# Patient Record
Sex: Female | Born: 1965 | Race: Asian
Health system: Southern US, Academic
[De-identification: ages and names within clinical notes are randomized; demographics above are authoritative.]

## PROBLEM LIST (undated history)

## (undated) ENCOUNTER — Encounter

## (undated) ENCOUNTER — Telehealth

## (undated) ENCOUNTER — Other Ambulatory Visit

## (undated) ENCOUNTER — Inpatient Hospital Stay

## (undated) DIAGNOSIS — I10 Essential (primary) hypertension: Secondary | ICD-10-CM

## (undated) HISTORY — PX: ABDOMINAL HYSTERECTOMY: SHX81

---

## 2015-11-05 ENCOUNTER — Ambulatory Visit: Attending: Family Medicine | Primary: Family Medicine

## 2015-11-05 DIAGNOSIS — Z9114 Patient's other noncompliance with medication regimen: Secondary | ICD-10-CM

## 2015-11-05 DIAGNOSIS — K210 Gastro-esophageal reflux disease with esophagitis: Secondary | ICD-10-CM

## 2015-11-05 DIAGNOSIS — Z114 Encounter for screening for human immunodeficiency virus [HIV]: Secondary | ICD-10-CM

## 2015-11-05 DIAGNOSIS — D229 Melanocytic nevi, unspecified: Secondary | ICD-10-CM

## 2015-11-05 DIAGNOSIS — I10 Essential (primary) hypertension: Secondary | ICD-10-CM

## 2015-11-05 DIAGNOSIS — Z23 Encounter for immunization: Secondary | ICD-10-CM

## 2015-11-05 DIAGNOSIS — G4733 Obstructive sleep apnea (adult) (pediatric): Secondary | ICD-10-CM

## 2015-11-05 DIAGNOSIS — Z1211 Encounter for screening for malignant neoplasm of colon: Secondary | ICD-10-CM

## 2015-11-05 DIAGNOSIS — Z6841 Body Mass Index (BMI) 40.0 and over, adult: Principal | ICD-10-CM

## 2015-11-05 DIAGNOSIS — Z8679 Personal history of other diseases of the circulatory system: Secondary | ICD-10-CM

## 2015-11-05 MED ORDER — OMEPRAZOLE 40 MG PO CPDR
40 mg | Freq: Every day | ORAL | 3 refills | Status: CP
Start: 2015-11-05 — End: 2015-12-24

## 2015-11-05 MED ORDER — AMLODIPINE BESYLATE 10 MG PO TABS
10 mg | Freq: Every day | ORAL | 0 refills | Status: CP
Start: 2015-11-05 — End: 2015-12-24

## 2015-11-05 MED ORDER — HYDROCHLOROTHIAZIDE 25 MG PO TABS
25 mg | Freq: Every day | ORAL | 0 refills | Status: CP
Start: 2015-11-05 — End: 2015-12-24

## 2015-11-05 NOTE — Progress Notes
Mouth/Throat: Oropharynx is clear and moist. No oropharyngeal exudate.   Eyes: Conjunctivae and EOM are normal. Pupils are equal, round, and reactive to light. Right eye exhibits no discharge. Left eye exhibits no discharge.   Neck: Normal range of motion. Neck supple. No JVD present. No thyromegaly present.   Cardiovascular: Normal rate, regular rhythm and normal heart sounds.  Exam reveals no gallop and no friction rub.    No murmur heard.  Pulmonary/Chest: Effort normal and breath sounds normal. She has no wheezes. She has no rales. She exhibits no tenderness.   Abdominal: Soft. Bowel sounds are normal. She exhibits no mass. There is no tenderness. There is no rebound. No hernia.   Musculoskeletal: Normal range of motion. She exhibits no edema or deformity.   Lymphadenopathy:     She has no cervical adenopathy.   Neurological: She is oriented to person, place, and time. No cranial nerve deficit.   Skin: Skin is warm. She is not diaphoretic. No erythema. No pallor.   Mole on the face    Psychiatric: She has a normal mood and affect. Her behavior is normal. Judgment and thought content normal.       Assessment:       ICD-10-CM ICD-9-CM    1. BMI 40.0-44.9, adult Z68.41 V85.41 Lipid Panel      Lipid Panel   2. Morbid obesity due to excess calories E66.01 278.01 CBC and Differential      Comprehensive Metabolic Panel      Lipid Panel      CBC and Differential      Comprehensive Metabolic Panel      Lipid Panel   3. Essential hypertension I10 401.9 CBC and Differential      Comprehensive Metabolic Panel      Lipid Panel      Refer to Ophthalmology      CBC and Differential      Comprehensive Metabolic Panel      Lipid Panel   4. Obstructive sleep apnea G47.33 327.23 DIAGNOSTIC SLEEP STUDY   5. Screen for colon cancer Z12.11 V76.51 IMMUNOCHEMICAL FECAL OCCULT BLOOD-DIAGNO      IMMUNOCHEMICAL FECAL OCCULT BLOOD-DIAGNO   6. Atypical mole D22.9 216.9 Refer to Dermatology

## 2015-11-05 NOTE — Progress Notes
Respiratory: Negative.    Cardiovascular: Negative.    Gastrointestinal: Negative.    Endocrine: Negative.    Genitourinary: Negative.    Musculoskeletal: Negative.    Neurological:        Snoring stop breathing at night feel tired during day time         Objective:        VITAL SIGNS (all recorded)      Clinic Vitals       11/05/15 0958 11/05/15 1024          Amb Encounter Vitals    Weight 106.5 kg (234 lb 12.8 oz)    -IC at 11/05/15 0959       Height 1.6 m (5\' 3" )    -IC at 11/05/15 0959       BMI (Calculated) 41.68    -IC at 11/05/15 0959       BSA (Calculated - sq m) 2.18    -IC at 11/05/15 0959       BP 139/90    -IC at 11/05/15 0959       BP Location Right upper arm    -IC at 11/05/15 0959       Position Sitting    -IC at 11/05/15 0959       Pulse 70    -IC at 11/05/15 0959       Pulse Source Radial    -IC at 11/05/15 0959       Pulse Quality Normal    -IC at 11/05/15 0959       Resp 18    -IC at 11/05/15 0959       Respiration Quality Normal    -IC at 11/05/15 0959       Temp 36.7 ?C (98.1 ?F)    -IC at 11/05/15 0959       Temperature Source Oral    -IC at 11/05/15 0959       Neck Cir  15.5 in    -MH at 11/05/15 1024      Education/Communication Barriers?    Learning/Communication Barriers? No    -IC at 11/05/15 [id number]       Fall Risk Assessment    Had recent fall / Last 6 months? No recent fall    -IC at 11/05/15 [id number]       Does patient have a fear of falling? No    -IC at 11/05/15 [id number]         User Key  (r) = Recorded By, (t) = Taken By, (c) = Cosigned By    [facility] Name Effective Dates    IC Class-Mercado, [place], [place] 05/14/15 -     MH Shela Commons, MD 06/03/15 -         Physical Exam   Constitutional: She is oriented to person, place, and time. She appears well-developed and well-nourished. No distress.   obese   HENT:   Head: Normocephalic and atraumatic.   Right Ear: External ear normal.   Left Ear: External ear normal.   Nose: Nose normal.

## 2015-11-05 NOTE — Addendum Note
Addended by: Kandice Hams on: 11/05/2015 11:21 AM      Modules accepted: Orders

## 2015-11-05 NOTE — Progress Notes
7. H/O medication noncompliance Z91.14 V15.81 hydroCHLOROthiazide (HYDRODIURIL) 25 MG Tablet      amLODIPine (NORVASC) 10 MG Tablet   8. H/O: hypertension Z86.79 V12.59 hydroCHLOROthiazide (HYDRODIURIL) 25 MG Tablet      amLODIPine (NORVASC) 10 MG Tablet   9. GERD with esophagitis K21.0 530.11 omeprazole (PriLOSEC) 40 MG Capsule Delayed Release          Plan:     Orders Placed This Encounter   Medications   ? hydroCHLOROthiazide (HYDRODIURIL) 25 MG Tablet     Sig: Take 1 tablet by mouth daily.     Dispense:  30 tablet     Refill:  0   ? amLODIPine (NORVASC) 10 MG Tablet     Sig: Take 1 tablet by mouth daily.     Dispense:  30 tablet     Refill:  0   ? omeprazole (PriLOSEC) 40 MG Capsule Delayed Release     Sig: Take 1 capsule by mouth daily.     Dispense:  90 capsule     Refill:  3     Orders Placed This Encounter   Procedures   ? IMMUNOCHEMICAL FECAL OCCULT BLOOD-DIAGNO   ? Comprehensive Metabolic Panel   ? Lipid Panel   ? Refer to Ophthalmology   ? Refer to Dermatology   ? DIAGNOSTIC SLEEP STUDY   Marcea's Estimated body mass index is 41.59 kg/(m^2) as calculated from the following:    Height as of this encounter: 1.6 m (5\' 3" ).    Weight as of this encounter: 106.5 kg (234 lb 12.8 oz).      The health risks associated with an elevated BMI were discussed and education was provided in the AVS.  See orders for any further follow up plans.      Health Maintenance was reviewed. The patient's HM Topic list was:                                            Health Maintenance   Topic Date Due   ? Lipid Profile  18-Aug-1965   ? Basic Metabolic Panel  [id number]   ? USPSTF HIV Risk Assessment  04/10/1980   ? Preventive Wellness Visit  04/11/1983   ? DTaP,Tdap,and Td Vaccines (1 - Tdap) 04/10/1984   ? Lipid Profile  04/11/2010   ? Colon Cancer Screening  04/11/2015   ? Influenza Vaccine (1) 10/11/2015   ? Mammogram Discussion  04/10/2016

## 2015-11-05 NOTE — Progress Notes
Subjective:   Kylie Maldonado is a 50 y.o. female being seen today for Chronic Care (Pt. complains of possible UTI, ) and Medications Refill       HPI Comments: New pt to clinic recently moved from [place]. H/o htn welll controlled on medication no chest painno sob no palpitatio . C/o snoring at night stop breathing very tired in the day time feel sleepy all day .   trying to lose weight mallapeti score iv neck size 15/12epworth score 16. c/o mole on the face bleeding    Medications Refill         Past Medical History:   Diagnosis Date   ? Hypertension      Past Surgical History:   Procedure Laterality Date   ? HYSTERECTOMY     ? KNEE SURGERY Right    ? KNEE SURGERY Right 03/2006   ? TUBAL LIGATION     ? TUBAL LIGATION  06/19/1986     History reviewed. No pertinent family history.  Social History     Social History   ? Marital status: Divorced     Spouse name: N/A   ? Number of children: N/A   ? Years of education: N/A     Occupational History   ? Not on file.     Social History Main Topics   ? Smoking status: Former Smoker   ? Smokeless tobacco: Never Used   ? Alcohol use Yes      Comment: 2x/mo   ? Drug use: No   ? Sexual activity: Yes     Partners: Male     Other Topics Concern   ? Not on file     Social History Narrative     Current Outpatient Prescriptions on File Prior to Visit   Medication Sig   ? amLODIPine (NORVASC) 10 MG Tablet Take by mouth daily.   ? [DISCONTINUED] amLODIPine (NORVASC) 10 MG Tablet Take 1 Tablet by mouth daily.   ? Black Cohosh 40 MG Capsule Take 40 mg by mouth daily.   ? hydroCHLOROthiazide (HYDRODIURIL) 25 MG Tablet Take by mouth daily.   ? [DISCONTINUED] hydroCHLOROthiazide (HYDRODIURIL) 25 MG Tablet Take 1 Tablet by mouth daily.   ? multivitamin Tablet Take by mouth daily.     No current facility-administered medications on file prior to visit.      No Known Allergies      Review of Systems  Review of Systems   Constitutional: Negative.    HENT: Negative.    Eyes: Negative.

## 2015-11-27 ENCOUNTER — Encounter: Primary: Family Medicine

## 2015-12-02 DIAGNOSIS — G4733 Obstructive sleep apnea (adult) (pediatric): Principal | ICD-10-CM

## 2015-12-05 ENCOUNTER — Encounter: Attending: Family Medicine | Primary: Family Medicine

## 2015-12-23 ENCOUNTER — Ambulatory Visit: Attending: Family Medicine | Primary: Family Medicine

## 2015-12-23 ENCOUNTER — Ambulatory Visit: Attending: Optometrist | Primary: Family Medicine

## 2015-12-23 DIAGNOSIS — L0100 Impetigo, unspecified: Secondary | ICD-10-CM

## 2015-12-23 DIAGNOSIS — H524 Presbyopia: Secondary | ICD-10-CM

## 2015-12-23 DIAGNOSIS — K210 Gastro-esophageal reflux disease with esophagitis: Secondary | ICD-10-CM

## 2015-12-23 DIAGNOSIS — Z8679 Personal history of other diseases of the circulatory system: Secondary | ICD-10-CM

## 2015-12-23 DIAGNOSIS — K219 Gastro-esophageal reflux disease without esophagitis: Secondary | ICD-10-CM

## 2015-12-23 DIAGNOSIS — I10 Essential (primary) hypertension: Secondary | ICD-10-CM

## 2015-12-23 DIAGNOSIS — H35033 Hypertensive retinopathy, bilateral: Principal | ICD-10-CM

## 2015-12-23 DIAGNOSIS — Z6841 Body Mass Index (BMI) 40.0 and over, adult: Secondary | ICD-10-CM

## 2015-12-23 DIAGNOSIS — Z9114 Patient's other noncompliance with medication regimen: Secondary | ICD-10-CM

## 2015-12-23 DIAGNOSIS — G4733 Obstructive sleep apnea (adult) (pediatric): Principal | ICD-10-CM

## 2015-12-23 DIAGNOSIS — H02889 Meibomian gland dysfunction of unspecified eye, unspecified eyelid: Secondary | ICD-10-CM

## 2015-12-23 MED ORDER — HYDROCHLOROTHIAZIDE 25 MG PO TABS
25 mg | Freq: Every day | ORAL | 3 refills | Status: CP
Start: 2015-12-23 — End: 2016-05-20

## 2015-12-23 MED ORDER — CEPHALEXIN 500 MG PO CAPS
500 mg | Freq: Three times a day (TID) | ORAL | 0 refills | Status: CP
Start: 2015-12-23 — End: 2016-03-11

## 2015-12-23 MED ORDER — OMEPRAZOLE 40 MG PO CPDR
40 mg | Freq: Every day | ORAL | 3 refills | Status: CP
Start: 2015-12-23 — End: 2016-05-20

## 2015-12-23 MED ORDER — AMLODIPINE BESYLATE 10 MG PO TABS
10 mg | Freq: Every day | ORAL | 3 refills | Status: CP
Start: 2015-12-23 — End: 2016-05-20

## 2015-12-23 NOTE — Progress Notes
asymmetry, speech difficulty, weakness, light-headedness, numbness and headaches.   Hematological: Negative.    Psychiatric/Behavioral: Positive for dysphoric mood. Negative for agitation, behavioral problems, confusion, decreased concentration, hallucinations, self-injury, sleep disturbance and suicidal ideas. The patient is nervous/anxious. The patient is not hyperactive.            Objective:        VITAL SIGNS (all recorded)      Clinic Vitals       12/23/15 1312             Amb Encounter Vitals    Weight 104.8 kg (231 lb 1.9 oz)    -IC at 12/23/15 1314       Height 1.6 m (5\' 3" )    -IC at 12/23/15 1314       BMI (Calculated) 41.03    -IC at 12/23/15 1314       BSA (Calculated - sq m) 2.16    -IC at 12/23/15 1314       BP 132/79    -IC at 12/23/15 1314       BP Location Right upper arm    -IC at 12/23/15 1314       Position Sitting    -IC at 12/23/15 1314       Pulse 79    -IC at 12/23/15 1314       Pulse Source Radial    -IC at 12/23/15 1314       Pulse Quality Normal    -IC at 12/23/15 1314       Resp 17    -IC at 12/23/15 1314       Respiration Quality Normal    -IC at 12/23/15 1314       Temp 36.6 ?C (97.8 ?F)    -IC at 12/23/15 1314       Temperature Source Oral    -IC at 12/23/15 1314       Pain Score Zero    -IC at 12/23/15 1314       Education/Communication Barriers?    Learning/Communication Barriers? No    -IC at 12/23/15 1314       Fall Risk Assessment    Had recent fall / Last 6 months? No recent fall    -IC at 12/23/15 1314       Does patient have a fear of falling? No    -IC at 12/23/15 1314         User Key  (r) = Recorded By, (t) = Taken By, (c) = Cosigned By    [facility] Name Effective Dates    IC Class-Mercado, [place], [place] 05/14/15 -         Physical Exam   Constitutional: She is oriented to person, place, and time. She appears well-developed and well-nourished. No distress.   HENT:   Head: Normocephalic and atraumatic.   Right Ear: External ear normal.   Left Ear: External ear normal.

## 2015-12-23 NOTE — Patient Instructions
Hypertension  Hypertension, commonly called high blood pressure, is when the force of blood pumping through your arteries is too strong. Your arteries are the blood vessels that carry blood from your heart throughout your body. A blood pressure reading consists of a higher number over a lower number, such as 110/72. The higher number (systolic) is the pressure inside your arteries when your heart pumps. The lower number (diastolic) is the pressure inside your arteries when your heart relaxes. Ideally you want your blood pressure below 120/80.  Hypertension forces your heart to work harder to pump blood. Your arteries may become narrow or stiff. Having untreated or uncontrolled hypertension can cause heart attack, stroke, kidney disease, and other problems.  RISK FACTORS  Some risk factors for high blood pressure are controllable. Others are not.   Risk factors you cannot control include:   ? Race. You may be at higher risk if you are African American.  ? Age. Risk increases with age.  ? Gender. Men are at higher risk than women before age 45 years. After age 65, women are at higher risk than men.  Risk factors you can control include:  ? Not getting enough exercise or physical activity.  ? Being overweight.  ? Getting too much fat, sugar, calories, or salt in your diet.  ? Drinking too much alcohol.  SIGNS AND SYMPTOMS  Hypertension does not usually cause signs or symptoms. Extremely high blood pressure (hypertensive crisis) may cause headache, anxiety, shortness of breath, and nosebleed.  DIAGNOSIS  To check if you have hypertension, your health care provider will measure your blood pressure while you are seated, with your arm held at the level of your heart. It should be measured at least twice using the same arm. Certain conditions can cause a difference in blood pressure between your right and left arms. A blood pressure reading that is higher than normal

## 2015-12-23 NOTE — Progress Notes
9. Gastroesophageal reflux disease without esophagitis K21.9 530.81           Plan:     Orders Placed This Encounter   Medications   ? amLODIPine (NORVASC) 10 MG Tablet     Sig: Take 1 tablet by mouth daily.     Dispense:  90 tablet     Refill:  3   ? hydroCHLOROthiazide (HYDRODIURIL) 25 MG Tablet     Sig: Take 1 tablet by mouth daily.     Dispense:  90 tablet     Refill:  3   ? omeprazole (PriLOSEC) 40 MG Capsule Delayed Release     Sig: Take 1 capsule by mouth daily.     Dispense:  90 capsule     Refill:  3   ? cephALEXin (KEFLEX) 500 MG Capsule     Sig: Take 1 capsule by mouth 3 times daily.     Dispense:  30 capsule     Refill:  0     No orders of the following type(s) were placed in this encounter: Procedures      Health Maintenance was reviewed. The patient's HM Topic list was:                                            Health Maintenance   Topic Date Due   ? Preventive Wellness Visit  04/11/1983   ? Breast Cancer Screening  04/11/2015   ? DTaP,Tdap,and Td Vaccines (1 - Tdap) 12/22/2016 (Originally 04/10/1984)   ? Lipid Profile  11/04/2016   ? Basic Metabolic Panel  [id number]   ? Colon Cancer Screening  11/04/2016   ? USPSTF HIV Risk Assessment  Completed   ? Influenza Vaccine  Completed         Goal: Diet and exercise, comply with medications as prescribed.  Return to clinic: if signs and symptoms fail to improve or if new symptoms appear.  Education material provided: disease management.    **Report: any medication side effects to pcp immediately.**

## 2015-12-23 NOTE — Progress Notes
Mild Hypertensive retinopathy OU  Educated patient on findings. Stressed BP control.     MGD OU  Ed pt on condition. Recommended artificial tears qid OU and warm compresses with lid massage bid OU.     Prsebyopia OU  Recommended OTC readers.        Monitor 43yr DFE, sooner prn.

## 2015-12-23 NOTE — Patient Instructions
prescribed. The medicine does not work as well when you skip doses. Skipping doses also puts you at risk for problems.  ? Do not smoke. ?  ? Monitor your blood pressure at home as directed by your health care provider.?  SEEK MEDICAL CARE IF:   ? You think you are having a reaction to medicines taken.  ? You have recurrent headaches or feel dizzy.  ? You have swelling in your ankles.  ? You have trouble with your vision.  SEEK IMMEDIATE MEDICAL CARE IF:  ? You develop a severe headache or confusion.  ? You have unusual weakness, numbness, or feel faint.  ? You have severe chest or abdominal pain.  ? You vomit repeatedly.  ? You have trouble breathing.  MAKE SURE YOU:   ? Understand these instructions.  ? Will watch your condition.  ? Will get help right away if you are not doing well or get worse.     This information is not intended to replace advice given to you by your health care provider. Make sure you discuss any questions you have with your health care provider.     Document Released: 01/26/2005 Document Revised: 06/12/2014 Document Reviewed: 11/18/2012  Elsevier Interactive Patient Education ?2017 [employer]

## 2015-12-23 NOTE — Progress Notes
Subjective:   Kylie Maldonado is a 50 y.o. female The patient is here today for follow up and management of multiple chronic medical conditions listed on the problem list. The patient is currently prescribed the medications listed in the medications list.  Previously recomended goals for self managment and compliance were reviewed. Pain level and fall risk were assesed.Health issues since the last visit were reviewed.  Current medical conditions are stable except as listed below.    The patient has new complaint today of bumps  Appearing on the inner side of thigh , oozing x couple of weeks oozing   Bp is controlled  Pt is not Diabetic  Osa  Goes for sleep study tomorrow  Jerrye Bushy doing well  Please see review of systems below.being seen today for Medications Refill; Hypertension ( 11/05/2015); Sleep Apnea; and Cyst (Small bumps inner thigh )       HPI    Past Medical History:   Diagnosis Date   ? Gastroesophageal reflux disease without esophagitis 12/23/2015   ? Hypertension      Past Surgical History:   Procedure Laterality Date   ? HYSTERECTOMY     ? KNEE SURGERY Right    ? KNEE SURGERY Right 03/2006   ? TUBAL LIGATION     ? TUBAL LIGATION  06/19/1986     Family History   Problem Relation Age of Onset   ? Glaucoma Maternal Grandmother      Social History     Social History   ? Marital status: Divorced     Spouse name: N/A   ? Number of children: N/A   ? Years of education: N/A     Occupational History   ? Not on file.     Social History Main Topics   ? Smoking status: Former Smoker   ? Smokeless tobacco: Never Used   ? Alcohol use Yes      Comment: 2x/mo   ? Drug use: No   ? Sexual activity: Yes     Partners: Male     Other Topics Concern   ? Not on file     Social History Narrative     Current Outpatient Prescriptions on File Prior to Visit   Medication Sig   ? [DISCONTINUED] amLODIPine (NORVASC) 10 MG Tablet Take by mouth daily.   ? [DISCONTINUED] amLODIPine (NORVASC) 10 MG Tablet Take 1 tablet by mouth daily.

## 2015-12-23 NOTE — Patient Instructions
on one occasion does not mean that you need treatment. If it is not clear whether you have high blood pressure, you may be asked to return on a different day to have your blood pressure checked again. Or, you may be asked to monitor your blood pressure at home for 1 or more weeks.  TREATMENT  Treating high blood pressure includes making lifestyle changes and possibly taking medicine. Living a healthy lifestyle can help lower high blood pressure. You may need to change some of your habits.  Lifestyle changes may include:  ? Following the DASH diet. This diet is high in fruits, vegetables, and whole grains. It is low in salt, red meat, and added sugars.  ? Keep your sodium intake below 2,300 mg per day.  ? Getting at least 30-45 minutes of aerobic exercise at least 4 times per week.  ? Losing weight if necessary.  ? Not smoking.  ? Limiting alcoholic beverages.  ? Learning ways to reduce stress.  Your health care provider may prescribe medicine if lifestyle changes are not enough to get your blood pressure under control, and if one of the following is true:  ? You are 18-59 years of age and your systolic blood pressure is above 140.  ? You are 60 years of age or older, and your systolic blood pressure is above 150.  ? Your diastolic blood pressure is above 90.  ? You have diabetes, and your systolic blood pressure is over 140 or your diastolic blood pressure is over 90.  ? You have kidney disease and your blood pressure is above 140/90.  ? You have heart disease and your blood pressure is above 140/90.  Your personal target blood pressure may vary depending on your medical conditions, your age, and other factors.  HOME CARE INSTRUCTIONS  ? Have your blood pressure rechecked as directed by your health care provider. ?  ? Take medicines only as directed by your health care provider. Follow the directions carefully. Blood pressure medicines must be taken as

## 2015-12-23 NOTE — Progress Notes
?   Black Cohosh 40 MG Capsule Take 40 mg by mouth daily.   ? [DISCONTINUED] hydroCHLOROthiazide (HYDRODIURIL) 25 MG Tablet Take by mouth daily.   ? [DISCONTINUED] hydroCHLOROthiazide (HYDRODIURIL) 25 MG Tablet Take 1 tablet by mouth daily.   ? multivitamin Tablet Take by mouth daily.   ? [DISCONTINUED] omeprazole (PriLOSEC) 40 MG Capsule Delayed Release Take 1 capsule by mouth daily.     No current facility-administered medications on file prior to visit.      No Known Allergies      Review of Systems  Review of Systems   Constitutional: Negative for activity change, appetite change, chills, fatigue and fever.   HENT: Negative for congestion, dental problem, drooling, ear discharge, ear pain, facial swelling, hearing loss, mouth sores, nosebleeds, postnasal drip, rhinorrhea, sinus pressure, sneezing, sore throat, tinnitus, trouble swallowing and voice change.    Eyes: Negative for photophobia, pain, redness, itching and visual disturbance.   Respiratory: Negative for cough, choking, chest tightness, shortness of breath, wheezing and stridor.    Cardiovascular: Negative for chest pain, palpitations and leg swelling.   Gastrointestinal: Negative for abdominal distention, abdominal pain, anal bleeding, blood in stool, constipation, diarrhea, nausea, rectal pain and vomiting.   Endocrine: Negative for heat intolerance, polydipsia, polyphagia and polyuria.   Genitourinary: Negative for decreased urine volume, difficulty urinating, dyspareunia, dysuria, enuresis, flank pain, frequency, urgency, vaginal bleeding, vaginal discharge and vaginal pain.   Musculoskeletal: Positive for arthralgias, back pain and myalgias. Negative for gait problem, joint swelling, neck pain and neck stiffness.   Skin: Positive for rash. Negative for color change, pallor and wound.   Allergic/Immunologic: Negative.    Neurological: Negative for dizziness, tremors, seizures, syncope, facial

## 2015-12-23 NOTE — Progress Notes
Nose: Nose normal.   Mouth/Throat: Oropharynx is clear and moist.   Eyes: Conjunctivae and EOM are normal. Pupils are equal, round, and reactive to light. Right eye exhibits no discharge. Left eye exhibits no discharge.   Neck: Normal range of motion. Neck supple. No thyromegaly present.   Cardiovascular: Normal rate, regular rhythm, normal heart sounds and intact distal pulses.    No murmur heard.  Pulmonary/Chest: Effort normal. No respiratory distress. She has no wheezes. She has no rales. She exhibits no tenderness.   Abdominal: Soft. Bowel sounds are normal. She exhibits no distension and no mass. There is no tenderness. There is no rebound and no guarding.   Musculoskeletal: She exhibits tenderness. She exhibits no edema or deformity.   Tender hip muscles   Tender ls spine  Tender b knees   Lymphadenopathy:     She has no cervical adenopathy.   Neurological: She is alert and oriented to person, place, and time. She has normal reflexes. She displays normal reflexes. No cranial nerve deficit. She exhibits normal muscle tone. Coordination normal.   Skin: Skin is warm and dry. Rash noted. She is not diaphoretic. No erythema. No pallor.   impetigo   Psychiatric: She has a normal mood and affect. Her behavior is normal. Judgment and thought content normal.        Assessment:       ICD-10-CM ICD-9-CM    1. Obstructive sleep apnea G47.33 327.23    2. Morbid obesity due to excess calories E66.01 278.01    3. Essential hypertension I10 401.9    4. BMI 40.0-44.9, adult Z68.41 V85.41    5. Impetigo any site L01.00 684 cephALEXin (KEFLEX) 500 MG Capsule   6. H/O medication noncompliance Z91.14 V15.81 amLODIPine (NORVASC) 10 MG Tablet      hydroCHLOROthiazide (HYDRODIURIL) 25 MG Tablet   7. H/O: hypertension Z86.79 V12.59 amLODIPine (NORVASC) 10 MG Tablet      hydroCHLOROthiazide (HYDRODIURIL) 25 MG Tablet   8. GERD with esophagitis K21.0 530.11 omeprazole (PriLOSEC) 40 MG Capsule Delayed Release

## 2015-12-24 ENCOUNTER — Inpatient Hospital Stay: Admit: 2015-12-24 | Discharge: 2015-12-26 | Attending: Pulmonary Disease | Primary: Family Medicine

## 2015-12-24 DIAGNOSIS — G4733 Obstructive sleep apnea (adult) (pediatric): Principal | ICD-10-CM

## 2015-12-24 DIAGNOSIS — K210 Gastro-esophageal reflux disease with esophagitis: Secondary | ICD-10-CM

## 2015-12-24 DIAGNOSIS — I10 Essential (primary) hypertension: Secondary | ICD-10-CM

## 2015-12-24 NOTE — Progress Notes
The patient came to the sleep center for a Home Sleep Study set up. The patient was checked in and all consent forms were signed. The patient was advised how to properly attach belts, nasal cannula, wrist heart rate monitor and the finger pulse ox probe. The device is set to start at 9:00 pm. The patient was advised the device should be returned the following day by noon. The patient was very pleasant and cooperative.    Wallie Char, RPSGT

## 2015-12-26 NOTE — Progress Notes
HOME SLEEP TESTING/TYPE 3 PORTABLE  Date/Time: 12/26/2015 9:36 AM  Performed by: Tessa Lerner  Authorized by: Tessa Lerner   Comments: I. PATIENT IDENTIFICATION  Patient Name: Kylie Maldonado  Sex: female  Age: 50 y.o.  DOB: 06/19/1965  Date of Study: 12/24/2015  MRN: [id number]  Referring Physician: Shela Commons, MD  Scoring PSG Tech: si  BMI: There is no height or weight on file to calculate BMI.  Reading Physician: Tessa Lerner, MD  Home Sleep Testing Unit: [id number]      II. INTRODUCTION    1. HISTORY:  Patient's history is obtained from review of the sleep study questionnaire, and include the following symptoms: Daytime fatigue and tiredness and Snoring      Epworth Sleeping Score:       The patient  reports that she has quit smoking. She has never used smokeless tobacco. She reports that she drinks alcohol. She reports that she does not use illicit drugs.  The patient family history includes Glaucoma in her maternal grandmother.  The patient  has a past medical history of Gastroesophageal reflux disease without esophagitis (12/23/2015) and Hypertension. She also has no past medical history of Abuse, adult physical, initial encounter; Allergy to environmental factors; Anemia; Anxiety; Arthritis; Asthma; Cancer; Cerebral artery occlusion with cerebral infarction; CHF (congestive heart failure); Chronic kidney disease; Chronic skin ulcer; Clotting disorder; COPD (chronic obstructive pulmonary disease); Depression; Diabetes mellitus; Emphysema of lung; Glaucoma; Heart attack; Heart murmur; History of general anesthesia complication; HIV infection; Hyperlipidemia; Meningitis; Neuromuscular disorder; Osteoporosis; Seizures; Sickle cell anemia; Substance abuse; Thyroid disease; or Tuberculosis.            2. QUESTIONNAIRE DATA    III.  Technical Description:  This was an unattended overnight diagnostic study performed in the in the patient's home monitoring the following physiologic parameters: Airflow,

## 2015-12-26 NOTE — Progress Notes
Oxygen saturation, respiratory effort and heart rate, body positioning and patient event. The study was performed with a Nox T3 by Carefusion Version 1.5 device.  Hypopneas were scored using the 4% desaturation rule as per the American Academy of Sleep Medicine.    Artifact:    Artifact was noted in the flow signal for a total of 0 minutes; in the effort belts for 0 minutes and in the pulse/ox signal for a total of 4 minutes    SUMMARY:     The total recording time was 473m.    The total apneas recorded were 36, Obstructive Apneas were 6, Unclassified Apneas were 0, Central Apneas were 0, Mixed Apneas were 0, Hypopneas were 30.  This yielded a total Apnea Hypopnea Indes (AHI) of 5 events per hour. The (AHI) in the supine position was 6/hr versus 3 in the non-supine position.  The patient spent 250 minutes in the supine position.    The average Oxygen saturation was 96% and the nadir oxygen saturation was 75%.  The patient spent 2 minutes below < or equal to 88%.    The average heart rate was 68 beats per minute and minimum pulse was 56 beats per minute.    VI. IMPRESSION, REPORT & PLAN  DX Code: Obstructive Sleep Apnea (G47.33)  CPT Code: [id number]) Sleep Study, Unattended    Overnight polysomnography done on 12/24/15  demonstrated an Apnea Hyponea Indes (AHI) of 5 events per hour,  suggestive of  mild sleep disordered breathing. and A repeat sleep study with CPAP titration for treatment of patient's sleep disordered breathing is recommended.

## 2016-01-07 ENCOUNTER — Ambulatory Visit: Attending: Family Medicine | Primary: Family Medicine

## 2016-01-07 DIAGNOSIS — I10 Essential (primary) hypertension: Secondary | ICD-10-CM

## 2016-01-07 DIAGNOSIS — Z6841 Body Mass Index (BMI) 40.0 and over, adult: Secondary | ICD-10-CM

## 2016-01-07 DIAGNOSIS — G4733 Obstructive sleep apnea (adult) (pediatric): Principal | ICD-10-CM

## 2016-01-07 NOTE — Progress Notes
?   cephALEXin (KEFLEX) 500 MG Capsule Take 1 capsule by mouth 3 times daily.   ? hydroCHLOROthiazide (HYDRODIURIL) 25 MG Tablet Take 1 tablet by mouth daily.   ? multivitamin Tablet Take by mouth daily.   ? omeprazole (PriLOSEC) 40 MG Capsule Delayed Release Take 1 capsule by mouth daily.     No current facility-administered medications on file prior to visit.      No Known Allergies      Review of Systems  Review of Systems   Constitutional: Negative.    HENT: Negative.    Eyes: Negative.    Respiratory: Negative.    Cardiovascular: Negative.    Gastrointestinal: Negative.    Endocrine: Negative.    Genitourinary: Negative.    Musculoskeletal: Negative.            Objective:        VITAL SIGNS (all recorded)      Clinic Vitals       01/07/16 0841             Amb Encounter Vitals    Weight 105.9 kg (233 lb 6.4 oz)    -IC at 01/07/16 0842       Height 1.6 m (5\' 3" )    -IC at 01/07/16 0842       BMI (Calculated) 41.43    -IC at 01/07/16 0842       BSA (Calculated - sq m) 2.17    -IC at 01/07/16 0842       BP 134/84    -IC at 01/07/16 0842       BP Location Right upper arm    -IC at 01/07/16 0842       Position Sitting    -IC at 01/07/16 0842       Pulse 60    -IC at 01/07/16 0842       Pulse Source Radial    -IC at 01/07/16 0842       Pulse Quality Normal    -IC at 01/07/16 0842       Resp 16    -IC at 01/07/16 0842       Respiration Quality Normal    -IC at 01/07/16 0842       Temp 36.8 ?C (98.2 ?F)    -IC at 01/07/16 0842       Temperature Source Oral    -IC at 01/07/16 0842       Pain Score EIGHT    -IC at 01/07/16 [id number]       Education/Communication Barriers?    Learning/Communication Barriers? No    -IC at 01/07/16 [id number]       Fall Risk Assessment    Had recent fall / Last 6 months? No recent fall    -IC at 01/07/16 [id number]       Does patient have a fear of falling? No    -IC at 01/07/16 [id number]         User Key  (r) = Recorded By, (t) = Taken By, (c) = Cosigned By    Initials Name Effective Dates

## 2016-01-07 NOTE — Progress Notes
Subjective:   Kylie Maldonado is a 50 y.o. female being seen today for Results (Sleep study); Hypertension (11/05/2015); and Pain (Pt. complains of waking up with severe R. lower side of back.)       HPI Comments: The patient is here today for follow up and management of multiple chronic medical conditions listed on the problem list. The patient is currently prescribed the medications listed in the medications list.  Previously recomended goals for self managment and compliance were reviewed. Pain level and fall risk were assesed.Health issues since the last visit were reviewed.  Current medical conditions are stable except as listed below.    The patient has new complaint today of bp good control sleep study show mild sleep apnea feeling drowsiy during day sleep not good . Trying to lose weight      Please see review of systems below.    Hypertension         Past Medical History:   Diagnosis Date   ? Gastroesophageal reflux disease without esophagitis 12/23/2015   ? Hypertension      Past Surgical History:   Procedure Laterality Date   ? HYSTERECTOMY     ? KNEE SURGERY Right    ? KNEE SURGERY Right 03/2006   ? TUBAL LIGATION     ? TUBAL LIGATION  06/19/1986     Family History   Problem Relation Age of Onset   ? Glaucoma Maternal Grandmother      Social History     Social History   ? Marital status: Divorced     Spouse name: N/A   ? Number of children: N/A   ? Years of education: N/A     Occupational History   ? Not on file.     Social History Main Topics   ? Smoking status: Former Smoker   ? Smokeless tobacco: Never Used   ? Alcohol use Yes      Comment: 2x/mo   ? Drug use: No   ? Sexual activity: Yes     Partners: Male     Other Topics Concern   ? Not on file     Social History Narrative     Current Outpatient Prescriptions on File Prior to Visit   Medication Sig   ? amLODIPine (NORVASC) 10 MG Tablet Take 1 tablet by mouth daily.   ? Black Cohosh 40 MG Capsule Take 40 mg by mouth daily.

## 2016-01-07 NOTE — Progress Notes
[facility] [place], [place], [place] 05/14/15 -         Physical Exam   Constitutional: She is oriented to person, place, and time. She appears well-developed and well-nourished. No distress.   HENT:   Head: Normocephalic and atraumatic.   Nose: Nose normal.   Mouth/Throat: Oropharynx is clear and moist.   Eyes: EOM are normal. Pupils are equal, round, and reactive to light.   Neck: Normal range of motion. Neck supple.   Cardiovascular: Normal rate and regular rhythm.    Pulmonary/Chest: Effort normal and breath sounds normal.   Neurological: She is alert and oriented to person, place, and time.   Skin: Skin is warm. She is not diaphoretic.   Psychiatric: She has a normal mood and affect.        Assessment:       ICD-10-CM ICD-9-CM    1. Obstructive sleep apnea G47.33 327.23 Refer to Sleep Studies   2. Essential hypertension I10 401.9    3. Morbid obesity due to excess calories E66.01 278.01    4. BMI 40.0-44.9, adult Z68.41 V85.41           Plan:     No orders of the following type(s) were placed in this encounter: Medications.     Orders Placed This Encounter   Procedures   ? Refer to Sleep Studies       Health Maintenance was reviewed. The patient's HM Topic list was:                                            Health Maintenance   Topic Date Due   ? Preventive Wellness Visit  04/11/1983   ? Breast Cancer Screening  03/11/2016 (Originally 04/11/2015)   ? DTaP,Tdap,and Td Vaccines (1 - Tdap) 12/22/2016 (Originally 04/10/1984)   ? Lipid Profile  11/04/2016   ? Basic Metabolic Panel  [id number]   ? Colon Cancer Screening  11/04/2016   ? USPSTF HIV Risk Assessment  Completed   ? Influenza Vaccine  Completed

## 2016-01-20 ENCOUNTER — Inpatient Hospital Stay: Admit: 2016-01-20 | Discharge: 2016-01-20 | Attending: Pulmonary Disease | Primary: Family Medicine

## 2016-01-20 DIAGNOSIS — Z7984 Long term (current) use of oral hypoglycemic drugs: Secondary | ICD-10-CM

## 2016-01-20 DIAGNOSIS — I10 Essential (primary) hypertension: Secondary | ICD-10-CM

## 2016-01-20 DIAGNOSIS — K209 Esophagitis, unspecified: Secondary | ICD-10-CM

## 2016-01-20 DIAGNOSIS — G4733 Obstructive sleep apnea (adult) (pediatric): Principal | ICD-10-CM

## 2016-01-20 NOTE — Progress Notes
(  Right); Hysterectomy; Tubal ligation; knee surgery (Right, 03/2006); and Tubal ligation (06/19/1986).  The patient family history includes Glaucoma in her maternal grandmother.  The patient's  reports that she has quit smoking. She has never used smokeless tobacco. She reports that she drinks alcohol. She reports that she does not use illicit drugs.    Current Outpatient Medications    Medication Sig Start Date End Date Taking? Authorizing Provider   amLODIPine (NORVASC) 10 MG Tablet Take 1 tablet by mouth daily. 12/23/15   Joretta Bachelor, MD   Black Cohosh 40 MG Capsule Take 40 mg by mouth daily.    Information, Historical   cephALEXin (KEFLEX) 500 MG Capsule Take 1 capsule by mouth 3 times daily. 12/23/15   Joretta Bachelor, MD   hydroCHLOROthiazide (HYDRODIURIL) 25 MG Tablet Take 1 tablet by mouth daily. 12/23/15   Joretta Bachelor, MD   multivitamin Tablet Take by mouth daily.    Information, Historical   omeprazole (PriLOSEC) 40 MG Capsule Delayed Release Take 1 capsule by mouth daily. 12/23/15   Joretta Bachelor, MD           Review of Systems:  Constitutional: no unexplained fevers.  Eyes: No recent visual changes or discomfort.  ENT: hearing unchanged:  CV: denies chest pain, DOE, palpitations, leg swelling  Resp: No new cough or change in chronic cough: denies hemoptysis, sputum production, or wheezing.  GI: denies changes in appetite or bowel habits;   Skin: denies recent changes in hair, skin, or nails.   Neuro: denies seizures, syncope or memory changes;  Psyche: no change in mood, level of anxiety, or ability to sleep. no undue sadness or suicidal thoughts.  Heme: No easy brusing or bleeding      Physical Exam:  Vitals:    01/20/16 1338   BP: 141/83   Pulse: 74   Resp: 18   Temp: 36.8 ?C (98.3 ?F)   Weight: 101.2 kg (223 lb)   Height: 1.6 m (5\' 3" )     GENERAL: Well developed, well nourished, appears stated age,  EYES: Lids without erythema or lesion, conjunctivae pink, sclera white.

## 2016-01-20 NOTE — Progress Notes
ENT: external ears symmetric without lesions or deformity,   Oropharynx without lesions, erythema or exudate.   MP 4  Tonsils 0  NECK: without thyromegaly, nodules or masses.   CV: RRR with normal S1/S2, no S3 or S4, no murmur: no JVD; pedal pulses 2+ and symmetric, no pedal edema.  RESPIRATORY: Non labored breathing, symmetric chest wall movement, No wheezes, rales or rhonchi, chest resonant.  ABD: soft, nondistended, with normoactive bowel sounds.   LYMPHATIC: no neck lymphadenopathy noted.  MUSCULOSKELETAL: Normal gait and station,   Digits/nails without cyanosis or clubbing.   NEUROLOGIC: Grossly Normal   SKIN: No rashes or suspicious lesions noted.   FPsyche: oriented to time, person, place; normal speech and content; appropriate mood and affect; interactive and responsive; memory, judgement, and insight are intact    Assessment:    ICD-10-CM ICD-9-CM    1. Obstructive sleep apnea G47.33 327.23 Refer to Sleep Studies      Refer to Sleep Studies      C-PAP machine           Plan:  OSA - mild.  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  She needs to be on CPAP ASAP for her work, so will giver her auto-CPAP as she also has no contraindications to auto-cpap.  Rules of PAP were explained  Obesity - weight loss  F/u few weeks for a compliance check.

## 2016-01-20 NOTE — Addendum Note
Encounter addended by: Dionicio Stall on: 01/20/2016  2:07 PM<BR>     Actions taken: Charge Capture section accepted

## 2016-01-20 NOTE — Progress Notes
Referring Physician: Shela Commons, MD      HPI:    Kylie Maldonado is a  50 y.o. female who comes in today for a new patient evaluation of OSA.  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  She already had a sleep study (HST) done in Nov 2017 that was notable for mild OSA with AHI of 5/hr.    Her nocturnal respiratory symptoms are positive for snoring.  These symptoms have been going on for years and have remained the same.Marland Kitchen    Keeps a fairly consistent sleep schedule  Upon awakening in the morning, the patient denies morning headaches.  She is is not tired/groggy in the morning.    During the day, the patient has an ESS of Total score: 9.  She denies being sleepy during the day.  She does not nap during the day.    The patient denies being sleepy while driving.  The patient's daytime symptoms do not interfere with her regular daytime activities.    The patient denies cataplexy, sleep paralysis, violent dreams, dream enactment, restless legs.    The patient's weight has decreased by about 20 lbs.  The patient's ESS today is Total score: 9.    The patient  has a past medical history of Gastroesophageal reflux disease without esophagitis (12/23/2015) and Hypertension. She also has no past medical history of Abuse, adult physical, initial encounter; Allergy to environmental factors; Anemia; Anxiety; Arthritis; Asthma; Cancer; Cerebral artery occlusion with cerebral infarction; CHF (congestive heart failure); Chronic kidney disease; Chronic skin ulcer; Clotting disorder; COPD (chronic obstructive pulmonary disease); Depression; Diabetes mellitus; Emphysema of lung; Glaucoma; Heart attack; Heart murmur; History of general anesthesia complication; HIV infection; Hyperlipidemia; Meningitis; Neuromuscular disorder; Osteoporosis; Seizures; Sickle cell anemia; Substance abuse; Thyroid disease; or Tuberculosis.  The patient  has a past surgical history that includes knee surgery

## 2016-01-22 ENCOUNTER — Encounter: Attending: Family Medicine | Primary: Family Medicine

## 2016-01-28 ENCOUNTER — Encounter: Attending: Family Medicine | Primary: Family Medicine

## 2016-03-10 ENCOUNTER — Ambulatory Visit: Attending: Family Medicine | Primary: Family Medicine

## 2016-03-10 DIAGNOSIS — B373 Candidiasis of vulva and vagina: Secondary | ICD-10-CM

## 2016-03-10 DIAGNOSIS — I10 Essential (primary) hypertension: Principal | ICD-10-CM

## 2016-03-10 DIAGNOSIS — K219 Gastro-esophageal reflux disease without esophagitis: Secondary | ICD-10-CM

## 2016-03-10 DIAGNOSIS — M62838 Other muscle spasm: Secondary | ICD-10-CM

## 2016-03-10 DIAGNOSIS — J301 Allergic rhinitis due to pollen: Secondary | ICD-10-CM

## 2016-03-10 DIAGNOSIS — B9689 Other specified bacterial agents as the cause of diseases classified elsewhere: Secondary | ICD-10-CM

## 2016-03-10 DIAGNOSIS — N760 Acute vaginitis: Secondary | ICD-10-CM

## 2016-03-10 MED ORDER — FLUCONAZOLE 150 MG PO TABS
150 mg | Freq: Once | ORAL | 0 refills | Status: CP
Start: 2016-03-10 — End: 2016-05-25

## 2016-03-10 MED ORDER — CYCLOBENZAPRINE HCL 10 MG PO TABS
10 mg | Freq: Three times a day (TID) | ORAL | 1 refills | Status: CP | PRN
Start: 2016-03-10 — End: ?

## 2016-03-10 MED ORDER — METRONIDAZOLE 500 MG PO TABS
500 mg | Freq: Two times a day (BID) | ORAL | 0 refills | Status: CP
Start: 2016-03-10 — End: 2017-12-17

## 2016-03-10 MED ORDER — LORATADINE 10 MG PO TABS
10 mg | Freq: Every day | ORAL | 1 refills | Status: CP
Start: 2016-03-10 — End: ?

## 2016-03-10 NOTE — Progress Notes
mouth 3 times daily.   ? hydroCHLOROthiazide (HYDRODIURIL) 25 MG Tablet Take 1 tablet by mouth daily.   ? multivitamin Tablet Take by mouth daily.   ? omeprazole (PriLOSEC) 40 MG Capsule Delayed Release Take 1 capsule by mouth daily.     No current facility-administered medications on file prior to visit.      No Known Allergies      Review of Systems  Review of Systems   Constitutional: Negative.    HENT: Positive for ear discharge and ear pain.    Eyes: Negative.    Respiratory: Negative.    Cardiovascular: Negative.    Gastrointestinal: Negative.    Endocrine: Negative.    Genitourinary: Positive for vaginal discharge.   Musculoskeletal: Negative.    Skin: Positive for rash.   Hematological: Negative.    Psychiatric/Behavioral: Negative.            Objective:        VITAL SIGNS (all recorded)      Clinic Vitals       03/10/16 1303             Amb Encounter Vitals    Weight 104.2 kg (229 lb 12.8 oz)    -SJ at 03/10/16 1305       Height 1.6 m (5\' 3" )    -SJ at 03/10/16 1305       BMI (Calculated) 40.79    -SJ at 03/10/16 1305       BSA (Calculated - sq m) 2.15    -SJ at 03/10/16 1305       BP (!)  140/99    -SJ at 03/10/16 1305       BP Location Right upper arm    -SJ at 03/10/16 1305       Position Sitting    -SJ at 03/10/16 1305       Pulse 68    -SJ at 03/10/16 1305       Pulse Source Radial    -SJ at 03/10/16 1305       Pulse Quality Normal    -SJ at 03/10/16 1305       Resp 12    -SJ at 03/10/16 1305       Respiration Quality Normal    -SJ at 03/10/16 1305       Temp 36.3 ?C (97.4 ?F)    -SJ at 03/10/16 1305       Temperature Source Oral    -SJ at 03/10/16 1305       Pain Score Zero    -SJ at 03/10/16 1305       Education/Communication Barriers?    Learning/Communication Barriers? No    -SJ at 03/10/16 1305       Fall Risk Assessment    Had recent fall / Last 6 months? No recent fall    -SJ at 03/10/16 1305       Does patient have a fear of falling? No    -SJ at 03/10/16 1305

## 2016-03-10 NOTE — Progress Notes
User Key  (r) = Recorded By, (t) = Taken By, (c) = Cosigned By    [facility] Name Effective Dates    [place], [place] 05/14/15 -         Physical Exam   Constitutional: She is oriented to person, place, and time. She appears well-developed and well-nourished. No distress.   HENT:   Head: Normocephalic and atraumatic.   Right Ear: External ear normal.   Left Ear: External ear normal.   Nose: Nose normal.   Mouth/Throat: Oropharynx is clear and moist. No oropharyngeal exudate.   Eyes: Conjunctivae and EOM are normal. Pupils are equal, round, and reactive to light. Right eye exhibits no discharge. Left eye exhibits no discharge.   Neck: Normal range of motion. Neck supple. No JVD present. No thyromegaly present.   Cardiovascular: Normal rate, regular rhythm and normal heart sounds.  Exam reveals no gallop and no friction rub.    No murmur heard.  Pulmonary/Chest: Effort normal and breath sounds normal. She has no wheezes. She has no rales. She exhibits no tenderness.   Abdominal: Soft. Bowel sounds are normal. She exhibits no mass. There is no tenderness. There is no rebound. No hernia.   Musculoskeletal: Normal range of motion. She exhibits no edema or deformity.   Lymphadenopathy:     She has no cervical adenopathy.   Neurological: She is oriented to person, place, and time. No cranial nerve deficit.   Skin: Skin is warm. Rash noted. She is not diaphoretic. No erythema. No pallor.   Psychiatric: She has a normal mood and affect. Her behavior is normal. Judgment and thought content normal.        Assessment:       ICD-10-CM ICD-9-CM    1. Essential hypertension I10 401.9    2. Gastroesophageal reflux disease without esophagitis K21.9 530.81    3. Vaginal candidiasis B37.3 112.1 fluconazole (DIFLUCAN) 150 MG PO Tablet   4. Bacterial vaginosis N76.0 616.10 metroNIDAZOLE (FLAGYL) 500 MG PO Tablet    B96.89 041.9    5. Spasm of muscle M62.838 728.85 cyclobenzaprine (FLEXERIL) 10 MG PO Tablet

## 2016-03-10 NOTE — Progress Notes
6. Seasonal allergic rhinitis due to pollen, unspecified chronicity J30.1 477.0 loratadine (CLARITIN) 10 MG PO Tablet          Plan:     Orders Placed This Encounter   Medications   ? loratadine (CLARITIN) 10 MG PO Tablet     Sig: Take 1 tablet by mouth daily.     Dispense:  30 tablet     Refill:  1   ? metroNIDAZOLE (FLAGYL) 500 MG PO Tablet     Sig: Take 1 tablet by mouth 2 times daily for 7 days.     Dispense:  14 tablet     Refill:  0   ? fluconazole (DIFLUCAN) 150 MG PO Tablet     Sig: Take 1 tablet by mouth once for 1 dose.     Dispense:  1 tablet     Refill:  0   ? cyclobenzaprine (FLEXERIL) 10 MG PO Tablet     Sig: Take 1 tablet by mouth every 8 hours as needed for muscle spasms.     Dispense:  30 tablet     Refill:  1     No orders of the following type(s) were placed in this encounter: Procedures      Health Maintenance was reviewed. The patient's HM Topic list was:                                            Health Maintenance   Topic Date Due   ? Preventive Wellness Visit  04/11/1983   ? Breast Cancer Screening  03/11/2016 (Originally 04/11/2015)   ? DTaP,Tdap,and Td Vaccines (1 - Tdap) 12/22/2016 (Originally 04/10/1984)   ? Lipid Profile  11/04/2016   ? Basic Metabolic Panel  [id number]   ? Colon Cancer Screening  11/04/2016   ? Zoster Vaccine (1) 04/10/2025   ? USPSTF HIV Risk Assessment  Completed   ? Influenza Vaccine  Completed

## 2016-03-10 NOTE — Progress Notes
Subjective:   Kylie Maldonado is a 51 y.o. female being seen today for Ear Pain (right ear pain for the past week) and Mass (between her legs began 2 weeks ago )       HPI Comments: The patient is here today for follow up and management of multiple chronic medical conditions listed on the problem list. The patient is currently prescribed the medications listed in the medications list.  Previously recomended goals for self managment and compliance were reviewed. Pain level and fall risk were assesed.Health issues since the last visit were reviewed.  Current medical conditions are stable except as listed below.    The patient has new complaint today of boil on the buttock . C/o vaginal disarge with itching and foul smell . C/o ear infection not better bp good control       Please see review of systems below.      Past Medical History:   Diagnosis Date   ? Gastroesophageal reflux disease without esophagitis 12/23/2015   ? Hypertension      Past Surgical History:   Procedure Laterality Date   ? HYSTERECTOMY     ? KNEE SURGERY Right    ? KNEE SURGERY Right 03/2006   ? TUBAL LIGATION     ? TUBAL LIGATION  06/19/1986     Family History   Problem Relation Age of Onset   ? Glaucoma Maternal Grandmother      Social History     Social History   ? Marital status: Divorced     Spouse name: N/A   ? Number of children: N/A   ? Years of education: N/A     Occupational History   ? Not on file.     Social History Main Topics   ? Smoking status: Former Smoker   ? Smokeless tobacco: Never Used   ? Alcohol use Yes      Comment: 2x/mo   ? Drug use: No   ? Sexual activity: Yes     Partners: Male     Other Topics Concern   ? Not on file     Social History Narrative     Current Outpatient Prescriptions on File Prior to Visit   Medication Sig   ? amLODIPine (NORVASC) 10 MG Tablet Take 1 tablet by mouth daily.   ? Black Cohosh 40 MG Capsule Take 40 mg by mouth daily.   ? [DISCONTINUED] cephALEXin (KEFLEX) 500 MG Capsule Take 1 capsule by

## 2016-03-17 ENCOUNTER — Encounter: Primary: Family Medicine

## 2016-03-23 ENCOUNTER — Encounter: Attending: Family Medicine | Primary: Family Medicine

## 2016-04-08 ENCOUNTER — Inpatient Hospital Stay: Admit: 2016-04-08 | Discharge: 2016-04-08 | Attending: Pulmonary Disease | Primary: Family Medicine

## 2016-04-08 DIAGNOSIS — K219 Gastro-esophageal reflux disease without esophagitis: Secondary | ICD-10-CM

## 2016-04-08 DIAGNOSIS — Z87891 Personal history of nicotine dependence: Secondary | ICD-10-CM

## 2016-04-08 DIAGNOSIS — I10 Essential (primary) hypertension: Secondary | ICD-10-CM

## 2016-04-08 DIAGNOSIS — G4733 Obstructive sleep apnea (adult) (pediatric): Principal | ICD-10-CM

## 2016-04-08 NOTE — Progress Notes
patient denies being sleepy while driving.  The patient's daytime symptoms do not interfere with her regular daytime activities.    The patient denies cataplexy, sleep paralysis, violent dreams, dream enactment, restless legs.    The patient's weight has decreased by about 20 lbs.    The patient  has a past medical history of Gastroesophageal reflux disease without esophagitis (12/23/2015) and Hypertension. She also has no past medical history of Abuse, adult physical, initial encounter; Allergy to environmental factors; Anemia; Anxiety; Arthritis; Asthma; Cancer; Cerebral artery occlusion with cerebral infarction; CHF (congestive heart failure); Chronic kidney disease; Chronic skin ulcer; Clotting disorder; COPD (chronic obstructive pulmonary disease); Depression; Diabetes mellitus; Emphysema of lung; Glaucoma; Heart attack; Heart murmur; History of general anesthesia complication; HIV infection; Hyperlipidemia; Meningitis; Neuromuscular disorder; Osteoporosis; Seizures; Sickle cell anemia; Substance abuse; Thyroid disease; or Tuberculosis.  The patient  has a past surgical history that includes knee surgery (Right); Hysterectomy; Tubal ligation; knee surgery (Right, 03/2006); and Tubal ligation (06/19/1986).  The patient family history includes Glaucoma in her maternal grandmother.  The patient's  reports that she has quit smoking. She has never used smokeless tobacco. She reports that she drinks alcohol. She reports that she does not use illicit drugs.    Current Outpatient Medications    Medication Sig Start Date End Date Taking? Authorizing Provider   amLODIPine (NORVASC) 10 MG Tablet Take 1 tablet by mouth daily. 12/23/15  Yes Joretta Bachelor, MD   Black Cohosh 40 MG Capsule Take 40 mg by mouth daily.   Yes Information, Historical   cyclobenzaprine (FLEXERIL) 10 MG PO Tablet Take 1 tablet by mouth every 8 hours as needed for muscle spasms. 03/10/16  Yes Shela Commons, MD

## 2016-04-08 NOTE — Progress Notes
hydroCHLOROthiazide (HYDRODIURIL) 25 MG Tablet Take 1 tablet by mouth daily. 12/23/15  Yes Joretta Bachelor, MD   loratadine (CLARITIN) 10 MG PO Tablet Take 1 tablet by mouth daily. 03/10/16  Yes Shela Commons, MD   multivitamin Tablet Take by mouth daily.   Yes Information, Historical   omeprazole (PriLOSEC) 40 MG Capsule Delayed Release Take 1 capsule by mouth daily. 12/23/15  Yes Joretta Bachelor, MD   fluconazole (DIFLUCAN) 150 MG PO Tablet Take 1 tablet by mouth once for 1 dose. 03/10/16 03/10/16  Shela Commons, MD   metroNIDAZOLE (FLAGYL) 500 MG PO Tablet Take 1 tablet by mouth 2 times daily for 7 days. 03/10/16 03/17/16  Shela Commons, MD       Review of Systems:  Constitutional: no unexplained fevers.  Eyes: No recent visual changes or discomfort.  ENT: hearing unchanged: no sinus issues  CV: denies chest pain, DOE, palpitations, occ leg swelling when working too much  Resp: No new cough or change in chronic cough: denies hemoptysis, sputum production, or wheezing.  GI: denies changes in appetite or bowel habits;   Skin: denies recent changes in hair, skin, or nails.   Neuro: denies seizures, syncope or memory changes;  Psyche: no change in mood, level of anxiety, or ability to sleep. no undue sadness or suicidal thoughts.  Heme: No easy brusing or bleeding    Physical Exam:  Vitals:    04/08/16 1018   BP: (!) 131/98   Pulse: 79   Resp: 12   Temp: 36.6 ?C (97.9 ?F)   Weight: 102.2 kg (225 lb 4.8 oz)   Height: 1.6 m (5\' 3" )     GENERAL: Obese, Well developed, well nourished, appears stated age,  EYES: Lids without erythema or lesion, conjunctivae pink, sclera white.  ENT: external ears symmetric without lesions or deformity,   Oropharynx without lesions, erythema or exudate. MP 4, Tonsils 0  NECK: without thyromegaly, nodules or masses. Neck circum 17in.  CV: RRR with normal S1/S2, no S3 or S4, no murmur: no JVD; pedal pulses 2+ and symmetric, no pedal edema.

## 2016-04-08 NOTE — Progress Notes
RESPIRATORY: Non labored breathing, symmetric chest wall movement, No wheezes, rales or rhonchi, chest resonant.  ABD: soft, nondistended, with normoactive bowel sounds.   LYMPHATIC: no neck lymphadenopathy noted.  MUSCULOSKELETAL: Normal gait and station,   Digits/nails without cyanosis or clubbing.   NEUROLOGIC: Grossly Normal   SKIN: No rashes or suspicious lesions noted.   Psyche: oriented to time, person, place; normal speech and content; appropriate mood and affect; interactive and responsive; memory, judgement, and insight are intact    Assessment:    ICD-10-CM ICD-9-CM    1. Obstructive sleep apnea G47.33 327.23 C-PAP machine   2. Morbid obesity due to excess calories E66.01 278.01        Plan:  OSA - mild.  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  Here for first compliance download with good control of OSA, but sub-optimal compliance due to issues with mask and schedule changes. Rules of PAP were reviewed.  Will do mask fitting and pt will improve compliance by taking machine with her to work.    Obesity - weight loss    F/u 3 months for compliance check.

## 2016-04-08 NOTE — Progress Notes
Referring Physician: No ref. provider found      HPI:    Kylie Maldonado is a  51 y.o. female who comes in today for a new patient evaluation of OSA.  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  Had a sleep study (HST) done in Nov 2017 that was notable for mild OSA with AHI of 5/hr. Initiated on Auto CPAP.      Today's visit:  The pt has been initiated on AutoCPAP since last visit and presents for first compliance download. Initially, had some issues with not getting the signal which was worked out with [employer].  She has been having issues with compliance due to unexpected changes in her schedule.  She has decided that she can fix this by taking her CPAP machine with her to work, and will improve her compliance greatly by making this change.  Also reports irritation with nasal pillow and wishes to have different mask to improve compliance.  She is struggling to keep the mask on.  She denies increased pressures, leakage. The patient further denies dry mouth.  The patient feels better with the PAP.  She notes improved  energy level, with less sleepiness.    The patient's ESS today is Total score: 9.    The patient's compliance download today   DME PROVIDER: [facility]  PAP COMPLIANCE DATA: 03/08/2016 - 04/06/2016  % Average Use all days 63   Median Use days Used 2 hrs 56 mins   % days used >=4 hours 27%   Median Leak L/min 0.7   AHI 1.2   PAP Pressure 5-20 cm H20   95 % Pressure or N/A 7.6   Mask Nasal pilllow     Previous visit:  Her nocturnal respiratory symptoms are positive for snoring.  These symptoms have been going on for years and have remained the same.Marland Kitchen    Keeps a fairly consistent sleep schedule  Upon awakening in the morning, the patient denies morning headaches.  She is is not tired/groggy in the morning.    During the day, the patient has an ESS of Total score: 9.  She denies being sleepy during the day.  She does not nap during the day.    The

## 2016-04-09 NOTE — Progress Notes
Teaching/Attestation Statement  I have seen and examined the patient.  I have reviewed the data, including the labs, radiology and I agree with the assessment and plan as outlined above.

## 2016-04-21 ENCOUNTER — Encounter: Attending: Obstetrics & Gynecology | Primary: Family Medicine

## 2016-04-23 ENCOUNTER — Inpatient Hospital Stay: Admit: 2016-04-23 | Discharge: 2016-04-23

## 2016-04-23 ENCOUNTER — Emergency Department: Admit: 2016-04-23 | Discharge: 2016-04-23

## 2016-04-23 DIAGNOSIS — J069 Acute upper respiratory infection, unspecified: Secondary | ICD-10-CM

## 2016-04-23 DIAGNOSIS — Z87891 Personal history of nicotine dependence: Secondary | ICD-10-CM

## 2016-04-23 DIAGNOSIS — I10 Essential (primary) hypertension: Secondary | ICD-10-CM

## 2016-04-23 DIAGNOSIS — Z79899 Other long term (current) drug therapy: Secondary | ICD-10-CM

## 2016-04-23 DIAGNOSIS — K219 Gastro-esophageal reflux disease without esophagitis: Secondary | ICD-10-CM

## 2016-04-23 DIAGNOSIS — R05 Cough: Secondary | ICD-10-CM

## 2016-04-23 DIAGNOSIS — Z9119 Patient's noncompliance with other medical treatment and regimen: Secondary | ICD-10-CM

## 2016-04-23 DIAGNOSIS — R509 Fever, unspecified: Principal | ICD-10-CM

## 2016-04-23 MED ORDER — GUAIFENESIN 100 MG/5ML PO SOLN
200 mg | ORAL | 0 refills | Status: CP | PRN
Start: 2016-04-23 — End: 2016-05-20

## 2016-04-23 NOTE — ED Notes
Time of discharge: 1143  AM., Patient discharged to  Home.  Patient discharged  ambulatory. to exit with belongings in  Stable condition.  Patient escorted by  no one., Written discharge instructions given to  patient.  Patient/recipient  verbalizes discharge instructions.

## 2016-04-23 NOTE — ED Provider Notes
Medical Evaluation Initiated:   Yes, filed at 04/23/16 0177  by Lyndon Code, PA-C             Marcello Moores, Derrell M, PA-C  04/23/16 [phone]

## 2016-04-23 NOTE — ED Provider Notes
History     Chief Complaint   Patient presents with   ? Flu Like Symptoms       Patient is a 51 y.o. female presenting with Cold Symptoms. The history is provided by the patient. No language interpreter was used.   Cold Symptoms   The primary symptoms include fever, cough and myalgias. Primary symptoms do not include fatigue, headaches, ear pain, sore throat, swollen glands, wheezing, abdominal pain, nausea, vomiting, arthralgias or rash. The current episode started more than 1 week ago. This is a new problem. The problem has not changed since onset.  The fever began more than 1 week ago. The fever has been resolved since its onset. The maximum temperature recorded prior to her arrival was unknown.   The cough began more than 1 week ago. The cough is productive, harsh and hacking. The sputum is yellow. Cough worsened by: nothing.   The onset of the illness is associated with exposure to sick contacts. Symptoms associated with the illness include congestion. The illness is not associated with chills, plugged ear sensation, facial pain or sinus pressure. Risk factors: n/a.       No Known Allergies    Patient's Medications   New Prescriptions    No medications on file   Previous Medications    AMLODIPINE (NORVASC) 10 MG TABLET    Take 1 tablet by mouth daily.    BLACK COHOSH 40 MG CAPSULE    Take 40 mg by mouth daily.    CYCLOBENZAPRINE (FLEXERIL) 10 MG PO TABLET    Take 1 tablet by mouth every 8 hours as needed for muscle spasms.    FLUCONAZOLE (DIFLUCAN) 150 MG PO TABLET    Take 1 tablet by mouth once for 1 dose.    HYDROCHLOROTHIAZIDE (HYDRODIURIL) 25 MG TABLET    Take 1 tablet by mouth daily.    LORATADINE (CLARITIN) 10 MG PO TABLET    Take 1 tablet by mouth daily.    METRONIDAZOLE (FLAGYL) 500 MG PO TABLET    Take 1 tablet by mouth 2 times daily for 7 days.    MULTIVITAMIN TABLET    Take by mouth daily.    OMEPRAZOLE (PRILOSEC) 40 MG CAPSULE DELAYED RELEASE    Take 1 capsule by mouth daily.

## 2016-04-23 NOTE — ED Provider Notes
Modified Medications    No medications on file   Discontinued Medications    No medications on file       Past Medical History:   Diagnosis Date   ? Gastroesophageal reflux disease without esophagitis 12/23/2015   ? Hypertension    ? Personal history of noncompliance with medical treatment, presenting hazards to health        Past Surgical History:   Procedure Laterality Date   ? ABDOMEN SURGERY     ? HYSTERECTOMY     ? KNEE SURGERY Right    ? KNEE SURGERY Right 03/2006   ? ORTHOPEDIC SURGERY     ? TUBAL LIGATION     ? TUBAL LIGATION  06/19/1986       Family History   Problem Relation Age of Onset   ? Glaucoma Maternal Grandmother        Social History     Social History   ? Marital status: Divorced     Spouse name: N/A   ? Number of children: N/A   ? Years of education: N/A     Social History Main Topics   ? Smoking status: Former Smoker   ? Smokeless tobacco: Never Used   ? Alcohol use 36.0 oz/week     3 Cans of beer per week      Comment: 2x/mo   ? Drug use: No   ? Sexual activity: Yes     Partners: Male     Other Topics Concern   ? None     Social History Narrative       Review of Systems   Constitutional: Positive for fever. Negative for chills and fatigue.   HENT: Positive for nasal congestion. Negative for ear pain, sore throat and sinus pressure.    Eyes: Negative.    Respiratory: Positive for cough. Negative for wheezing.    Cardiovascular: Negative.    Gastrointestinal: Negative.  Negative for nausea, vomiting and abdominal pain.   Genitourinary: Negative.    Musculoskeletal: Positive for myalgias. Negative for arthralgias.   Skin: Negative.  Negative for rash.   Neurological: Negative.  Negative for headaches.   Psychiatric/Behavioral: Negative.    Allergic/Immunologic: negative.    Endocrine: negative.       Physical Exam     ED Triage Vitals   BP 04/23/16 0929 157/99   Pulse 04/23/16 0929 88   Resp 04/23/16 0929 20   Temp 04/23/16 0929 37.1 ?C (98.7 ?F)   Temp src 04/23/16 0929 Oral

## 2016-04-23 NOTE — ED Provider Notes
Musculoskeletal: She exhibits no edema or tenderness.   Neurological: She is alert and oriented to person, place, and time. She has normal strength. She is not disoriented. She displays no atrophy and no tremor. No cranial nerve deficit or sensory deficit. She exhibits normal muscle tone. She displays a negative Romberg sign. She displays no seizure activity. Coordination and gait normal. GCS eye subscore is 4. GCS verbal subscore is 5. GCS motor subscore is 6.   Skin: She is not diaphoretic.   Psychiatric: She has a normal mood and affect. Her behavior is normal. Judgment and thought content normal.   Nursing note and vitals reviewed.      Differential DDx: Flu, Pneumonia, URI, Bronchitis, and other    Is this an Emergent Medical Condition? Yes - Severe Pain/Acute Onset of Symptons, Yes - Threat to Patient or Fetus, Yes - Impairment of Bodily Function and Yes - Dysfunction of Organ or Part  409.901 FS  641.19 FS  627.732 (16) FS    ED Workup   Procedures    Labs:  - - No data to display      Imaging (Read by ED Provider):        EKG (Read by ED Provider):  }         ED Course & Re-Evaluation     ED Course   noted that the pt was informed of her CXR and that it was neg for acute dz. The pt will be D/c home with medication for her cough and told to return to the ED if her condition get's worse.  The pt is non-toxic appearing and in no distress at this time.Derrell Gildardo Griffes, PA-C 12:04 PM 04/23/2016      MDM   Decide to obtain history from someone other than the patient: No    Decide to obtain previous medical records: No    Clinical Lab Test(s): N/A    Diagnostic Tests (Radiology, EKG): Yes    Independent Visualization (ED [place], Wet Prep, Other): No    Discussed patient with NON-ED Provider: None      ED Disposition   ED Disposition: Discharge      ED Clinical Impression   ED Clinical Impression:   Cough in adult  URI, acute      ED Patient Status   Patient Status:   Good        ED Medical Evaluation Initiated

## 2016-04-23 NOTE — ED Provider Notes
daily.Disp-90 tablet, R-3, E-Prescribing      multivitamin Tablet Take by mouth daily.Historical Med      cyclobenzaprine (FLEXERIL) 10 MG PO Tablet Take 1 tablet by mouth every 8 hours as needed for muscle spasms.Disp-30 tablet, R-1, E-Prescribing      fluconazole (DIFLUCAN) 150 MG PO Tablet Take 1 tablet by mouth once for 1 dose.Disp-1 tablet, R-0, E-Prescribing      loratadine (CLARITIN) 10 MG PO Tablet Take 1 tablet by mouth daily.Disp-30 tablet, R-1, E-Prescribing      metroNIDAZOLE (FLAGYL) 500 MG PO Tablet Take 1 tablet by mouth 2 times daily for 7 days.Disp-14 tablet, R-0, E-Prescribing      omeprazole (PriLOSEC) 40 MG Capsule Delayed Release Take 1 capsule by mouth daily.Disp-90 capsule, R-3, E-Prescribing             Past Medical History:   Diagnosis Date   ? Gastroesophageal reflux disease without esophagitis 12/23/2015   ? Hypertension    ? Personal history of noncompliance with medical treatment, presenting hazards to health        Past Surgical History:   Procedure Laterality Date   ? ABDOMEN SURGERY     ? HYSTERECTOMY     ? KNEE SURGERY Right    ? KNEE SURGERY Right 03/2006   ? ORTHOPEDIC SURGERY     ? TUBAL LIGATION     ? TUBAL LIGATION  06/19/1986       Family History   Problem Relation Age of Onset   ? Glaucoma Maternal Grandmother        Social History     Social History   ? Marital status: Divorced     Spouse name: N/A   ? Number of children: N/A   ? Years of education: N/A     Social History Main Topics   ? Smoking status: Former Smoker   ? Smokeless tobacco: Never Used   ? Alcohol use 36.0 oz/week     3 Cans of beer per week      Comment: 2x/mo   ? Drug use: No   ? Sexual activity: Yes     Partners: Male     Other Topics Concern   ? None     Social History Narrative       Review of Systems   Constitutional: Positive for fever. Negative for chills and fatigue.   HENT: Positive for nasal congestion. Negative for ear pain, sore throat and sinus pressure.    Eyes: Negative.

## 2016-04-23 NOTE — ED Provider Notes
Height 04/23/16 0929 1.6 m   Weight 04/23/16 0929 102.1 kg   SpO2 04/23/16 0929 100 %   BMI (Calculated) 04/23/16 0929 39.94             Physical Exam    Differential DDx: Flu, Pneumonia, URI, Bronchitis, and other    Is this an Emergent Medical Condition? Yes - Severe Pain/Acute Onset of Symptons, Yes - Threat to Patient or Fetus, Yes - Impairment of Bodily Function and Yes - Dysfunction of Organ or Part  409.901 FS  641.19 FS  627.732 (16) FS    ED Workup   Procedures    Labs:  - - No data to display      Imaging (Read by ED Provider):        EKG (Read by ED Provider):  }         ED Course & Re-Evaluation     ED Course         MDM   Decide to obtain history from someone other than the patient: {SH ED [place] EXBMWUX:32440}    Decide to obtain previous medical records: [facility] ED [place] MDM - PREVIOUS MED REC - NO NUU:72536}    Clinical Lab Test(s): {SH ED [place] MDM ORDERED AND REVIEWED:28124}    Diagnostic Tests (Radiology, EKG): {SH ED [place] MDM ORDERED AND REVIEWED:28124}    Independent Visualization (ED [place], Wet Prep, Other): {SH ED [place] Quint MDM NO YES [id number]:25956}    Discussed patient with NON-ED Provider: {SH ED [place] MDM - ANOTHER LOVFIEPP:29518}      ED Disposition   ED Disposition: No ED Disposition Set      ED Clinical Impression   ED Clinical Impression:   No Clinical Impression Set      ED Patient Status   Patient Status:   Good        ED Medical Evaluation Initiated   Medical Evaluation Initiated:   Yes, filed at 04/23/16 8416  by Lyndon Code, PA-C

## 2016-04-23 NOTE — ED Provider Notes
Respiratory: Positive for cough. Negative for wheezing.    Cardiovascular: Negative.    Gastrointestinal: Negative.  Negative for nausea, vomiting and abdominal pain.   Genitourinary: Negative.    Musculoskeletal: Positive for myalgias. Negative for arthralgias.   Skin: Negative.  Negative for rash.   Neurological: Negative.  Negative for headaches.   Psychiatric/Behavioral: Negative.    Allergic/Immunologic: negative.    Endocrine: negative.       Physical Exam       ED Triage Vitals   BP 04/23/16 0929 157/99   Pulse 04/23/16 0929 88   Resp 04/23/16 0929 20   Temp 04/23/16 0929 37.1 ?C (98.7 ?F)   Temp src 04/23/16 0929 Oral   Height 04/23/16 0929 1.6 m   Weight 04/23/16 0929 102.1 kg   SpO2 04/23/16 0929 100 %   BMI (Calculated) 04/23/16 0929 39.94             Physical Exam   Constitutional: She is oriented to person, place, and time. She appears well-developed and well-nourished. No distress. She is not intubated.   HENT:   Head: Normocephalic and atraumatic.   Right Ear: External ear normal.   Left Ear: External ear normal.   Nose: Nose normal.   Mouth/Throat: Oropharynx is clear and moist. No oropharyngeal exudate.   Eyes: Conjunctivae and EOM are normal. Pupils are equal, round, and reactive to light. Right eye exhibits no discharge. Left eye exhibits no discharge. No scleral icterus.   Cardiovascular: Normal rate, regular rhythm and normal heart sounds.  Exam reveals no gallop and no friction rub.    No murmur heard.  Pulmonary/Chest: Effort normal. No accessory muscle usage. No apnea, no tachypnea and no bradypnea. She is not intubated. No respiratory distress. She has no decreased breath sounds. She has no wheezes. She has rhonchi in the right upper field, the right middle field, the left upper field and the left middle field. She has no rales.           Abdominal: Soft. Bowel sounds are normal. She exhibits no distension and no mass. There is no tenderness. There is no rebound and no guarding.

## 2016-04-23 NOTE — ED Triage Notes
Pt ambulates to triage w/ c/o "flu" like symptoms x "a couple of weeks". Pt states generalized body pain, cough, fever/chills, headache, diarrhea, and eye pain. Pt denies nausea and vomiting. Resps are even and unlabored. Pt to FLEX for further eval.

## 2016-04-23 NOTE — ED Provider Notes
History     Chief Complaint   Patient presents with   ? Flu Like Symptoms       HPI Comments: 51 y/o b/f present to the ED with c/c of flu like s/s for the past 2-weeks.  The pt states that she has had subjective fever at home and that her cough has been productive with yellowish sputum for the past 2-weeks.  The pt has also had body aches as well.      Patient is a 51 y.o. female presenting with Cold Symptoms. The history is provided by the patient. No language interpreter was used.   Cold Symptoms   The primary symptoms include fever, cough and myalgias. Primary symptoms do not include fatigue, headaches, ear pain, sore throat, swollen glands, wheezing, abdominal pain, nausea, vomiting, arthralgias or rash. The current episode started more than 1 week ago. This is a new problem. The problem has not changed since onset.  The fever began more than 1 week ago. The fever has been resolved since its onset. The maximum temperature recorded prior to her arrival was unknown.   The cough began more than 1 week ago. The cough is productive, harsh and hacking. The sputum is yellow. Cough worsened by: nothing.   The onset of the illness is associated with exposure to sick contacts. Symptoms associated with the illness include congestion. The illness is not associated with chills, plugged ear sensation, facial pain or sinus pressure. Risk factors: n/a.       No Known Allergies    Discharge Medication List as of 04/23/2016 11:29 AM      START taking these medications    Details   guaiFENesin 100 MG/5ML PO Solution Take 10 mLs by mouth every 4 hours as needed.Disp-120 mL, R-0, Normal         CONTINUE these medications which have NOT CHANGED    Details   amLODIPine (NORVASC) 10 MG Tablet Take 1 tablet by mouth daily.Disp-90 tablet, R-3, E-Prescribing      Black Cohosh 40 MG Capsule Take 40 mg by mouth daily.Historical Med      hydroCHLOROthiazide (HYDRODIURIL) 25 MG Tablet Take 1 tablet by mouth

## 2016-05-01 ENCOUNTER — Encounter: Attending: Obstetrics & Gynecology | Primary: Family Medicine

## 2016-05-13 ENCOUNTER — Encounter: Attending: Family Medicine | Primary: Family Medicine

## 2016-05-20 ENCOUNTER — Ambulatory Visit: Attending: Family Medicine | Primary: Family Medicine

## 2016-05-20 ENCOUNTER — Encounter: Attending: Family Medicine | Primary: Family Medicine

## 2016-05-20 DIAGNOSIS — N898 Other specified noninflammatory disorders of vagina: Secondary | ICD-10-CM

## 2016-05-20 DIAGNOSIS — Z9114 Patient's other noncompliance with medication regimen: Secondary | ICD-10-CM

## 2016-05-20 DIAGNOSIS — K210 Gastro-esophageal reflux disease with esophagitis: Secondary | ICD-10-CM

## 2016-05-20 DIAGNOSIS — I10 Essential (primary) hypertension: Principal | ICD-10-CM

## 2016-05-20 DIAGNOSIS — Z1231 Encounter for screening mammogram for malignant neoplasm of breast: Secondary | ICD-10-CM

## 2016-05-20 DIAGNOSIS — Z8679 Personal history of other diseases of the circulatory system: Secondary | ICD-10-CM

## 2016-05-20 MED ORDER — OMEPRAZOLE 40 MG PO CPDR
40 mg | Freq: Every day | ORAL | 3 refills | Status: CP
Start: 2016-05-20 — End: ?

## 2016-05-20 MED ORDER — AMLODIPINE BESYLATE 10 MG PO TABS
10 mg | Freq: Every day | ORAL | 3 refills | Status: CP
Start: 2016-05-20 — End: 2017-05-07

## 2016-05-20 MED ORDER — HYDROCHLOROTHIAZIDE 25 MG PO TABS
25 mg | Freq: Every day | ORAL | 3 refills | Status: CP
Start: 2016-05-20 — End: 2017-05-07

## 2016-05-20 NOTE — Progress Notes
? [  DISCONTINUED] amLODIPine (NORVASC) 10 MG Tablet Take 1 tablet by mouth daily.   ? Black Cohosh 40 MG Capsule Take 40 mg by mouth daily.   ? cyclobenzaprine (FLEXERIL) 10 MG PO Tablet Take 1 tablet by mouth every 8 hours as needed for muscle spasms.   ? fluconazole (DIFLUCAN) 150 MG PO Tablet Take 1 tablet by mouth once for 1 dose.   ? [DISCONTINUED] guaiFENesin 100 MG/5ML PO Solution Take 10 mLs by mouth every 4 hours as needed.   ? [DISCONTINUED] hydroCHLOROthiazide (HYDRODIURIL) 25 MG Tablet Take 1 tablet by mouth daily.   ? loratadine (CLARITIN) 10 MG PO Tablet Take 1 tablet by mouth daily.   ? metroNIDAZOLE (FLAGYL) 500 MG PO Tablet Take 1 tablet by mouth 2 times daily for 7 days.   ? multivitamin Tablet Take by mouth daily.   ? [DISCONTINUED] omeprazole (PriLOSEC) 40 MG Capsule Delayed Release Take 1 capsule by mouth daily.     No current facility-administered medications on file prior to visit.      No Known Allergies      Review of Systems  Review of Systems   Constitutional: Negative.    HENT: Negative.    Eyes: Negative.    Respiratory: Negative.    Cardiovascular: Negative.    Gastrointestinal: Negative.    Endocrine: Negative.    Genitourinary: Positive for vaginal bleeding and vaginal discharge.   Musculoskeletal: Negative.            Objective:        VITAL SIGNS (all recorded)      Clinic Vitals       05/20/16 0850             Amb Encounter Vitals    Weight 102.7 kg (226 lb 8 oz)    -CC at 05/20/16 0851       Height 1.6 m (5\' 3" )    -CC at 05/20/16 0851       BMI (Calculated) 40.21    -CC at 05/20/16 0851       BSA (Calculated - sq m) 2.14    -CC at 05/20/16 0851       BP 135/90    -CC at 05/20/16 0851       BP Location Left upper arm    -CC at 05/20/16 0851       Position Sitting    -CC at 05/20/16 0851       Pulse 72    -CC at 05/20/16 0851       Pulse Source Brachial    -CC at 05/20/16 0851       Resp 16    -CC at 05/20/16 0851       Respiration Quality Normal    -CC at 05/20/16 4166

## 2016-05-20 NOTE — Progress Notes
Subjective:   Kylie Maldonado is a 51 y.o. female being seen today for Vaginitis (requesting rx for vaginal odor and yeast fater taking flagyl) and Hypertension (f/u htn 11/05/15)       HPI Comments: The patient is here today for follow up and management of multiple chronic medical conditions listed on the problem list. The patient is currently prescribed the medications listed in the medications list.  Previously recomended goals for self managment and compliance were reviewed. Pain level and fall risk were assesed.Health issues since the last visit were reviewed.  Current medical conditions are stable except as listed below.    The patient has new complaint today of vaginal itching and vaginal disarge with foul odour bleed with sex not pain ful . bp good control gerd stable      Please see review of systems below.    Hypertension         Past Medical History:   Diagnosis Date   ? Gastroesophageal reflux disease without esophagitis 12/23/2015   ? Hypertension    ? Personal history of noncompliance with medical treatment, presenting hazards to health      Past Surgical History:   Procedure Laterality Date   ? ABDOMEN SURGERY     ? HYSTERECTOMY     ? KNEE SURGERY Right    ? KNEE SURGERY Right 03/2006   ? ORTHOPEDIC SURGERY     ? TUBAL LIGATION     ? TUBAL LIGATION  06/19/1986     Family History   Problem Relation Age of Onset   ? Glaucoma Maternal Grandmother      Social History     Social History   ? Marital status: Divorced     Spouse name: N/A   ? Number of children: N/A   ? Years of education: N/A     Occupational History   ? Not on file.     Social History Main Topics   ? Smoking status: Former Smoker   ? Smokeless tobacco: Never Used   ? Alcohol use 36.0 oz/week     3 Cans of beer per week      Comment: 2x/mo   ? Drug use: No   ? Sexual activity: Yes     Partners: Male     Other Topics Concern   ? Not on file     Social History Narrative     Current Outpatient Prescriptions on File Prior to Visit   Medication Sig

## 2016-05-20 NOTE — Progress Notes
Musculoskeletal: Normal range of motion. She exhibits no edema or deformity.   Lymphadenopathy:     She has no cervical adenopathy.   Neurological: She is oriented to person, place, and time. No cranial nerve deficit.   Skin: Skin is warm. Rash noted. She is not diaphoretic. No erythema. No pallor.   Psychiatric: She has a normal mood and affect. Her behavior is normal. Judgment and thought content normal.        Assessment:       ICD-10-CM ICD-9-CM    1. Essential hypertension I10 401.9    2. GERD with esophagitis K21.0 530.11 omeprazole (PriLOSEC) 40 MG PO Capsule Delayed Release   3. H/O medication noncompliance Z91.14 V15.81 hydroCHLOROthiazide (HYDRODIURIL) 25 MG PO Tablet      amLODIPine (NORVASC) 10 MG PO Tablet   4. H/O: hypertension Z86.79 V12.59 hydroCHLOROthiazide (HYDRODIURIL) 25 MG PO Tablet      amLODIPine (NORVASC) 10 MG PO Tablet   5. Vaginal discharge N89.8 623.5    6. Encounter for screening mammogram for malignant neoplasm of breast Z12.31 V76.12 MAM Screening          Plan:     Orders Placed This Encounter   Medications   ? omeprazole (PriLOSEC) 40 MG PO Capsule Delayed Release     Sig: Take 1 capsule by mouth daily.     Dispense:  90 capsule     Refill:  3   ? hydroCHLOROthiazide (HYDRODIURIL) 25 MG PO Tablet     Sig: Take 1 tablet by mouth daily.     Dispense:  90 tablet     Refill:  3   ? amLODIPine (NORVASC) 10 MG PO Tablet     Sig: Take 1 tablet by mouth daily.     Dispense:  90 tablet     Refill:  3     Orders Placed This Encounter   Procedures   ? MAM Screening       Health Maintenance was reviewed. The patient's HM Topic list was:                                            Health Maintenance   Topic Date Due   ? Preventive Wellness Visit  04/11/1983   ? Breast Cancer Screening  04/11/2015   ? DTaP,Tdap,and Td Vaccines (1 - Tdap) 12/22/2016 (Originally 04/10/1984)   ? Lipid Profile  11/04/2016   ? Basic Metabolic Panel  67/89/3810   ? Colon Cancer Screening  11/04/2016

## 2016-05-20 NOTE — Progress Notes
?   Zoster Vaccine (1) 04/10/2025   ? USPSTF HIV Risk Assessment  Completed   ? Influenza Vaccine  Completed

## 2016-05-20 NOTE — Progress Notes
Temp 36.8 ?C (98.3 ?F)    -CC at 05/20/16 0851       Temperature Source Oral    -CC at 05/20/16 0851       Pain Score Zero    -CC at 05/20/16 2951       Education/Communication Barriers?    Learning/Communication Barriers? No    -CC at 05/20/16 0851       Fall Risk Assessment    Had recent fall / Last 6 months? No recent fall    -CC at 05/20/16 0851       Does patient have a fear of falling? No    -CC at 05/20/16 0851         User Key  (r) = Recorded By, (t) = Taken By, (c) = Cosigned By    [place] Name Effective Dates    CC Patrice [place], [place] 05/14/15 -         Physical Exam   Constitutional: She is oriented to person, place, and time. She appears well-developed and well-nourished. No distress.   HENT:   Head: Normocephalic and atraumatic.   Right Ear: External ear normal.   Left Ear: External ear normal.   Nose: Nose normal.   Mouth/Throat: Oropharynx is clear and moist. No oropharyngeal exudate.   Eyes: Conjunctivae and EOM are normal. Pupils are equal, round, and reactive to light. Right eye exhibits no discharge. Left eye exhibits no discharge.   Neck: Normal range of motion. Neck supple. No JVD present. No thyromegaly present.   Cardiovascular: Normal rate, regular rhythm and normal heart sounds.  Exam reveals no gallop and no friction rub.    No murmur heard.  Pulmonary/Chest: Effort normal and breath sounds normal. She has no wheezes. She has no rales. She exhibits no tenderness.   Abdominal: Soft. Bowel sounds are normal. She exhibits no mass. There is no tenderness. There is no rebound. No hernia.   Genitourinary: Rectum normal and vagina normal. Rectal exam shows guaiac negative stool. Pelvic exam was performed with patient supine. There is no rash, lesion or injury on the right labia. There is no rash, lesion or injury on the left labia. Cervix exhibits discharge. Cervix exhibits no motion tenderness. Right adnexum displays no mass, no tenderness and no fullness. Left adnexum displays no fullness.

## 2016-05-25 DIAGNOSIS — B373 Candidiasis of vulva and vagina: Principal | ICD-10-CM

## 2016-05-25 MED ORDER — FLUCONAZOLE 150 MG PO TABS
0 refills | Status: CP
Start: 2016-05-25 — End: 2017-12-17

## 2016-05-28 ENCOUNTER — Encounter: Attending: Family Medicine | Primary: Family Medicine

## 2016-05-28 ENCOUNTER — Inpatient Hospital Stay: Admit: 2016-05-28 | Discharge: 2016-05-29 | Primary: Family Medicine

## 2016-05-28 DIAGNOSIS — Z1231 Encounter for screening mammogram for malignant neoplasm of breast: Principal | ICD-10-CM

## 2016-06-03 ENCOUNTER — Encounter: Attending: Family Medicine | Primary: Family Medicine

## 2016-06-03 ENCOUNTER — Encounter: Attending: Obstetrics & Gynecology | Primary: Family Medicine

## 2016-06-24 ENCOUNTER — Encounter: Attending: Family Medicine | Primary: Family Medicine

## 2016-07-01 ENCOUNTER — Encounter: Primary: Family Medicine

## 2016-07-07 ENCOUNTER — Encounter: Primary: Family Medicine

## 2016-07-08 ENCOUNTER — Encounter: Attending: Family Medicine | Primary: Family Medicine

## 2016-09-07 ENCOUNTER — Encounter: Primary: Family Medicine

## 2016-09-14 ENCOUNTER — Encounter: Primary: Family Medicine

## 2016-11-23 ENCOUNTER — Encounter: Attending: Family Medicine | Primary: Family Medicine

## 2017-03-15 ENCOUNTER — Encounter: Attending: Family Medicine | Primary: Family Medicine

## 2017-05-07 DIAGNOSIS — Z9114 Patient's other noncompliance with medication regimen: Principal | ICD-10-CM

## 2017-05-07 DIAGNOSIS — Z8679 Personal history of other diseases of the circulatory system: Secondary | ICD-10-CM

## 2017-05-07 MED ORDER — AMLODIPINE BESYLATE 10 MG PO TABS
10 mg | Freq: Every day | ORAL | 3 refills | Status: CP
Start: 2017-05-07 — End: ?

## 2017-05-07 MED ORDER — HYDROCHLOROTHIAZIDE 25 MG PO TABS
25 mg | Freq: Every day | ORAL | 3 refills | Status: CP
Start: 2017-05-07 — End: ?

## 2017-05-31 ENCOUNTER — Inpatient Hospital Stay: Admit: 2017-05-31 | Discharge: 2017-06-01 | Primary: Family Medicine

## 2017-05-31 DIAGNOSIS — Z1231 Encounter for screening mammogram for malignant neoplasm of breast: Principal | ICD-10-CM

## 2017-05-31 DIAGNOSIS — Z9119 Patient's noncompliance with other medical treatment and regimen: Secondary | ICD-10-CM

## 2017-05-31 DIAGNOSIS — K219 Gastro-esophageal reflux disease without esophagitis: Secondary | ICD-10-CM

## 2017-05-31 DIAGNOSIS — I10 Essential (primary) hypertension: Principal | ICD-10-CM

## 2017-06-01 ENCOUNTER — Ambulatory Visit: Attending: Family Medicine | Primary: Family Medicine

## 2017-06-01 ENCOUNTER — Inpatient Hospital Stay: Admit: 2017-06-01 | Discharge: 2017-06-02 | Primary: Family Medicine

## 2017-06-01 ENCOUNTER — Encounter: Attending: Family Medicine | Primary: Family Medicine

## 2017-06-01 ENCOUNTER — Encounter: Attending: Ophthalmology | Primary: Family Medicine

## 2017-06-01 DIAGNOSIS — I10 Essential (primary) hypertension: Principal | ICD-10-CM

## 2017-06-01 DIAGNOSIS — M5137 Other intervertebral disc degeneration, lumbosacral region: Principal | ICD-10-CM

## 2017-06-01 DIAGNOSIS — M47817 Spondylosis without myelopathy or radiculopathy, lumbosacral region: Secondary | ICD-10-CM

## 2017-06-01 DIAGNOSIS — Z6841 Body Mass Index (BMI) 40.0 and over, adult: Secondary | ICD-10-CM

## 2017-06-01 DIAGNOSIS — Z1211 Encounter for screening for malignant neoplasm of colon: Secondary | ICD-10-CM

## 2017-06-01 DIAGNOSIS — J301 Allergic rhinitis due to pollen: Secondary | ICD-10-CM

## 2017-06-01 DIAGNOSIS — E785 Hyperlipidemia, unspecified: Secondary | ICD-10-CM

## 2017-06-01 DIAGNOSIS — K210 Gastro-esophageal reflux disease with esophagitis: Secondary | ICD-10-CM

## 2017-06-01 DIAGNOSIS — Z9119 Patient's noncompliance with other medical treatment and regimen: Secondary | ICD-10-CM

## 2017-06-01 DIAGNOSIS — K219 Gastro-esophageal reflux disease without esophagitis: Secondary | ICD-10-CM

## 2017-06-01 DIAGNOSIS — M545 Low back pain: Secondary | ICD-10-CM

## 2017-06-01 DIAGNOSIS — G8929 Other chronic pain: Secondary | ICD-10-CM

## 2017-06-01 DIAGNOSIS — G4733 Obstructive sleep apnea (adult) (pediatric): Secondary | ICD-10-CM

## 2017-07-09 ENCOUNTER — Ambulatory Visit: Attending: Ophthalmology | Primary: Family Medicine

## 2017-07-09 DIAGNOSIS — K219 Gastro-esophageal reflux disease without esophagitis: Secondary | ICD-10-CM

## 2017-07-09 DIAGNOSIS — H04123 Dry eye syndrome of bilateral lacrimal glands: Secondary | ICD-10-CM

## 2017-07-09 DIAGNOSIS — H524 Presbyopia: Secondary | ICD-10-CM

## 2017-07-09 DIAGNOSIS — I10 Essential (primary) hypertension: Principal | ICD-10-CM

## 2017-07-09 DIAGNOSIS — H35033 Hypertensive retinopathy, bilateral: Principal | ICD-10-CM

## 2017-07-09 DIAGNOSIS — G43909 Migraine, unspecified, not intractable, without status migrainosus: Secondary | ICD-10-CM

## 2017-07-09 DIAGNOSIS — Z9119 Patient's noncompliance with other medical treatment and regimen: Secondary | ICD-10-CM

## 2017-07-09 DIAGNOSIS — S0510XA Contusion of eyeball and orbital tissues, unspecified eye, initial encounter: Secondary | ICD-10-CM

## 2017-07-30 ENCOUNTER — Encounter: Attending: Family | Primary: Family Medicine

## 2017-08-02 ENCOUNTER — Encounter: Attending: Family | Primary: Family Medicine

## 2017-08-09 ENCOUNTER — Encounter: Attending: Family Medicine | Primary: Family Medicine

## 2017-08-16 ENCOUNTER — Encounter: Attending: Family Medicine | Primary: Family Medicine

## 2017-08-30 ENCOUNTER — Encounter: Attending: Family Medicine | Primary: Family Medicine

## 2017-10-01 ENCOUNTER — Encounter: Attending: Family Medicine | Primary: Family Medicine

## 2017-10-18 ENCOUNTER — Encounter: Attending: Family Medicine | Primary: Family Medicine

## 2017-10-20 ENCOUNTER — Encounter: Attending: Family Medicine | Primary: Family Medicine

## 2017-12-10 ENCOUNTER — Encounter: Attending: Family Medicine | Primary: Family Medicine

## 2017-12-17 ENCOUNTER — Ambulatory Visit: Attending: Family Medicine | Primary: Family Medicine

## 2017-12-17 DIAGNOSIS — G43909 Migraine, unspecified, not intractable, without status migrainosus: Secondary | ICD-10-CM

## 2017-12-17 DIAGNOSIS — S0510XA Contusion of eyeball and orbital tissues, unspecified eye, initial encounter: Secondary | ICD-10-CM

## 2017-12-17 DIAGNOSIS — K210 Gastro-esophageal reflux disease with esophagitis: Principal | ICD-10-CM

## 2017-12-17 DIAGNOSIS — Z9119 Patient's noncompliance with other medical treatment and regimen: Secondary | ICD-10-CM

## 2017-12-17 DIAGNOSIS — I10 Essential (primary) hypertension: Principal | ICD-10-CM

## 2017-12-17 DIAGNOSIS — K219 Gastro-esophageal reflux disease without esophagitis: Secondary | ICD-10-CM

## 2017-12-17 DIAGNOSIS — J301 Allergic rhinitis due to pollen: Secondary | ICD-10-CM

## 2017-12-17 DIAGNOSIS — B9689 Other specified bacterial agents as the cause of diseases classified elsewhere: Secondary | ICD-10-CM

## 2017-12-17 DIAGNOSIS — N760 Acute vaginitis: Secondary | ICD-10-CM

## 2017-12-17 MED ORDER — SULFAMETHOXAZOLE-TRIMETHOPRIM 800-160 MG PO TABS
1 | ORAL_TABLET | Freq: Two times a day (BID) | ORAL | 0 refills
Start: 2017-12-17 — End: 2017-12-17

## 2017-12-27 ENCOUNTER — Encounter: Attending: Family Medicine | Primary: Family Medicine

## 2018-02-18 ENCOUNTER — Ambulatory Visit: Attending: Family | Primary: Family Medicine

## 2018-02-18 DIAGNOSIS — R399 Unspecified symptoms and signs involving the genitourinary system: Secondary | ICD-10-CM

## 2018-02-18 DIAGNOSIS — K219 Gastro-esophageal reflux disease without esophagitis: Secondary | ICD-10-CM

## 2018-02-18 DIAGNOSIS — G43909 Migraine, unspecified, not intractable, without status migrainosus: Secondary | ICD-10-CM

## 2018-02-18 DIAGNOSIS — Z6841 Body Mass Index (BMI) 40.0 and over, adult: Secondary | ICD-10-CM

## 2018-02-18 DIAGNOSIS — M545 Low back pain: Secondary | ICD-10-CM

## 2018-02-18 DIAGNOSIS — I10 Essential (primary) hypertension: Principal | ICD-10-CM

## 2018-02-18 DIAGNOSIS — S0510XA Contusion of eyeball and orbital tissues, unspecified eye, initial encounter: Secondary | ICD-10-CM

## 2018-02-18 DIAGNOSIS — Z9119 Patient's noncompliance with other medical treatment and regimen: Secondary | ICD-10-CM

## 2018-02-18 DIAGNOSIS — R300 Dysuria: Secondary | ICD-10-CM

## 2018-02-21 DIAGNOSIS — Z9119 Patient's noncompliance with other medical treatment and regimen: Secondary | ICD-10-CM

## 2018-02-21 DIAGNOSIS — K219 Gastro-esophageal reflux disease without esophagitis: Secondary | ICD-10-CM

## 2018-02-21 DIAGNOSIS — S0510XA Contusion of eyeball and orbital tissues, unspecified eye, initial encounter: Secondary | ICD-10-CM

## 2018-02-21 DIAGNOSIS — I10 Essential (primary) hypertension: Principal | ICD-10-CM

## 2018-02-21 DIAGNOSIS — G43909 Migraine, unspecified, not intractable, without status migrainosus: Secondary | ICD-10-CM

## 2018-03-30 ENCOUNTER — Emergency Department: Admit: 2018-03-30 | Discharge: 2018-03-30

## 2018-03-30 ENCOUNTER — Inpatient Hospital Stay: Admit: 2018-03-30 | Discharge: 2018-03-30

## 2018-03-30 DIAGNOSIS — Z83511 Family history of glaucoma: Secondary | ICD-10-CM

## 2018-03-30 DIAGNOSIS — R0602 Shortness of breath: Principal | ICD-10-CM

## 2018-03-30 DIAGNOSIS — M549 Dorsalgia, unspecified: Secondary | ICD-10-CM

## 2018-03-30 DIAGNOSIS — Z87891 Personal history of nicotine dependence: Secondary | ICD-10-CM

## 2018-03-30 DIAGNOSIS — K219 Gastro-esophageal reflux disease without esophagitis: Secondary | ICD-10-CM

## 2018-03-30 DIAGNOSIS — I10 Essential (primary) hypertension: Principal | ICD-10-CM

## 2018-03-30 DIAGNOSIS — M545 Low back pain: Secondary | ICD-10-CM

## 2018-03-30 DIAGNOSIS — R0789 Other chest pain: Secondary | ICD-10-CM

## 2018-03-30 DIAGNOSIS — Z90710 Acquired absence of both cervix and uterus: Secondary | ICD-10-CM

## 2018-03-30 DIAGNOSIS — S0510XA Contusion of eyeball and orbital tissues, unspecified eye, initial encounter: Secondary | ICD-10-CM

## 2018-03-30 DIAGNOSIS — G43909 Migraine, unspecified, not intractable, without status migrainosus: Secondary | ICD-10-CM

## 2018-03-30 DIAGNOSIS — Z79899 Other long term (current) drug therapy: Secondary | ICD-10-CM

## 2018-03-30 DIAGNOSIS — Z9119 Patient's noncompliance with other medical treatment and regimen: Secondary | ICD-10-CM

## 2018-04-01 ENCOUNTER — Encounter: Attending: Family Medicine | Primary: Family Medicine

## 2018-05-03 ENCOUNTER — Encounter: Attending: Obstetrics & Gynecology | Primary: Family Medicine

## 2018-05-16 ENCOUNTER — Encounter: Attending: Obstetrics & Gynecology | Primary: Family Medicine

## 2018-05-27 DIAGNOSIS — Z9114 Patient's other noncompliance with medication regimen: Principal | ICD-10-CM

## 2018-05-27 DIAGNOSIS — Z8679 Personal history of other diseases of the circulatory system: Secondary | ICD-10-CM

## 2018-05-27 MED ORDER — AMLODIPINE BESYLATE 10 MG PO TABS
10 mg | Freq: Every day | ORAL | 3 refills | Status: CP
Start: 2018-05-27 — End: 2018-06-01

## 2018-06-01 DIAGNOSIS — Z8679 Personal history of other diseases of the circulatory system: Secondary | ICD-10-CM

## 2018-06-01 DIAGNOSIS — Z9114 Patient's other noncompliance with medication regimen: Principal | ICD-10-CM

## 2018-06-01 MED ORDER — HYDROCHLOROTHIAZIDE 25 MG PO TABS
25 mg | Freq: Every day | ORAL | 3 refills | Status: CP
Start: 2018-06-01 — End: ?

## 2018-06-01 MED ORDER — AMLODIPINE BESYLATE 10 MG PO TABS
10 mg | Freq: Every day | ORAL | 3 refills | Status: CP
Start: 2018-06-01 — End: ?

## 2018-06-06 ENCOUNTER — Encounter: Attending: Family Medicine | Primary: Family Medicine

## 2018-06-27 ENCOUNTER — Ambulatory Visit: Attending: Family Medicine | Primary: Family Medicine

## 2018-06-27 DIAGNOSIS — M545 Low back pain: Secondary | ICD-10-CM

## 2018-06-27 DIAGNOSIS — Z1211 Encounter for screening for malignant neoplasm of colon: Principal | ICD-10-CM

## 2018-06-27 DIAGNOSIS — K219 Gastro-esophageal reflux disease without esophagitis: Secondary | ICD-10-CM

## 2018-06-27 DIAGNOSIS — Z23 Encounter for immunization: Secondary | ICD-10-CM

## 2018-06-27 DIAGNOSIS — I10 Essential (primary) hypertension: Secondary | ICD-10-CM

## 2018-06-27 DIAGNOSIS — Z9119 Patient's noncompliance with other medical treatment and regimen: Secondary | ICD-10-CM

## 2018-06-27 DIAGNOSIS — G4733 Obstructive sleep apnea (adult) (pediatric): Secondary | ICD-10-CM

## 2018-06-27 DIAGNOSIS — K210 Gastro-esophageal reflux disease with esophagitis: Secondary | ICD-10-CM

## 2018-06-27 DIAGNOSIS — Z1159 Encounter for screening for other viral diseases: Secondary | ICD-10-CM

## 2018-06-27 DIAGNOSIS — G43909 Migraine, unspecified, not intractable, without status migrainosus: Secondary | ICD-10-CM

## 2018-06-27 DIAGNOSIS — J301 Allergic rhinitis due to pollen: Secondary | ICD-10-CM

## 2018-06-27 DIAGNOSIS — S0510XA Contusion of eyeball and orbital tissues, unspecified eye, initial encounter: Secondary | ICD-10-CM

## 2018-06-27 MED ORDER — ZOSTER VAC RECOMB ADJUVANTED 50 MCG/0.5ML IM SUSR
.5 mL | Freq: Once | INTRAMUSCULAR | 1 refills | Status: CP
Start: 2018-06-27 — End: ?

## 2018-06-27 MED ORDER — PHENAZOPYRIDINE HCL 200 MG PO TABS
200 mg | Freq: Three times a day (TID) | ORAL
Start: 2018-06-27 — End: ?

## 2018-06-27 MED ORDER — NITROFURANTOIN MONOHYD MACRO 100 MG PO CAPS
100 mg | Freq: Two times a day (BID) | ORAL
Start: 2018-06-27 — End: ?

## 2018-07-13 ENCOUNTER — Encounter: Attending: Ophthalmology | Primary: Family Medicine

## 2018-07-13 DIAGNOSIS — R195 Other fecal abnormalities: Principal | ICD-10-CM

## 2018-07-29 ENCOUNTER — Encounter: Primary: Family Medicine

## 2018-07-29 ENCOUNTER — Encounter: Attending: Family Medicine | Primary: Family Medicine

## 2018-08-02 DIAGNOSIS — Z1231 Encounter for screening mammogram for malignant neoplasm of breast: Principal | ICD-10-CM

## 2018-08-08 ENCOUNTER — Ambulatory Visit
Admit: 2018-08-08 | Discharge: 2018-08-09 | Attending: Student in an Organized Health Care Education/Training Program | Primary: Family Medicine

## 2018-08-08 ENCOUNTER — Ambulatory Visit: Attending: Family Medicine | Primary: Family Medicine

## 2018-08-08 ENCOUNTER — Encounter: Attending: Ophthalmology | Primary: Family Medicine

## 2018-08-08 DIAGNOSIS — I10 Essential (primary) hypertension: Principal | ICD-10-CM

## 2018-08-08 DIAGNOSIS — Z202 Contact with and (suspected) exposure to infections with a predominantly sexual mode of transmission: Secondary | ICD-10-CM

## 2018-08-08 DIAGNOSIS — K219 Gastro-esophageal reflux disease without esophagitis: Secondary | ICD-10-CM

## 2018-08-08 DIAGNOSIS — R87610 Atypical squamous cells of undetermined significance on cytologic smear of cervix (ASC-US): Principal | ICD-10-CM

## 2018-08-08 DIAGNOSIS — Z9119 Patient's noncompliance with other medical treatment and regimen: Secondary | ICD-10-CM

## 2018-08-08 DIAGNOSIS — S0510XA Contusion of eyeball and orbital tissues, unspecified eye, initial encounter: Secondary | ICD-10-CM

## 2018-08-08 DIAGNOSIS — J301 Allergic rhinitis due to pollen: Secondary | ICD-10-CM

## 2018-08-08 DIAGNOSIS — Z1159 Encounter for screening for other viral diseases: Secondary | ICD-10-CM

## 2018-08-08 DIAGNOSIS — G43909 Migraine, unspecified, not intractable, without status migrainosus: Secondary | ICD-10-CM

## 2018-08-08 DIAGNOSIS — K210 Gastro-esophageal reflux disease with esophagitis: Secondary | ICD-10-CM

## 2018-08-08 DIAGNOSIS — R102 Pelvic and perineal pain: Secondary | ICD-10-CM

## 2018-08-08 DIAGNOSIS — R87810 Cervical high risk human papillomavirus (HPV) DNA test positive: Secondary | ICD-10-CM

## 2018-08-09 ENCOUNTER — Inpatient Hospital Stay: Admit: 2018-08-09 | Discharge: 2018-08-10 | Primary: Family Medicine

## 2018-08-09 DIAGNOSIS — K219 Gastro-esophageal reflux disease without esophagitis: Secondary | ICD-10-CM

## 2018-08-09 DIAGNOSIS — G43909 Migraine, unspecified, not intractable, without status migrainosus: Secondary | ICD-10-CM

## 2018-08-09 DIAGNOSIS — Z9119 Patient's noncompliance with other medical treatment and regimen: Secondary | ICD-10-CM

## 2018-08-09 DIAGNOSIS — S0510XA Contusion of eyeball and orbital tissues, unspecified eye, initial encounter: Secondary | ICD-10-CM

## 2018-08-09 DIAGNOSIS — I10 Essential (primary) hypertension: Principal | ICD-10-CM

## 2018-08-19 ENCOUNTER — Encounter: Attending: Family Medicine | Primary: Family Medicine

## 2018-08-19 DIAGNOSIS — I10 Essential (primary) hypertension: Principal | ICD-10-CM

## 2018-08-19 DIAGNOSIS — K219 Gastro-esophageal reflux disease without esophagitis: Secondary | ICD-10-CM

## 2018-08-19 DIAGNOSIS — Z9119 Patient's noncompliance with other medical treatment and regimen: Secondary | ICD-10-CM

## 2018-08-19 DIAGNOSIS — S0510XA Contusion of eyeball and orbital tissues, unspecified eye, initial encounter: Secondary | ICD-10-CM

## 2018-08-19 DIAGNOSIS — G43909 Migraine, unspecified, not intractable, without status migrainosus: Secondary | ICD-10-CM

## 2018-09-05 ENCOUNTER — Telehealth: Admit: 2018-09-05 | Discharge: 2018-09-06 | Attending: Family | Primary: Family Medicine

## 2018-09-05 DIAGNOSIS — S0510XA Contusion of eyeball and orbital tissues, unspecified eye, initial encounter: Secondary | ICD-10-CM

## 2018-09-05 DIAGNOSIS — R195 Other fecal abnormalities: Principal | ICD-10-CM

## 2018-09-05 DIAGNOSIS — Z800 Family history of malignant neoplasm of digestive organs: Secondary | ICD-10-CM

## 2018-09-05 DIAGNOSIS — G43909 Migraine, unspecified, not intractable, without status migrainosus: Secondary | ICD-10-CM

## 2018-09-05 DIAGNOSIS — I10 Essential (primary) hypertension: Principal | ICD-10-CM

## 2018-09-05 DIAGNOSIS — Z9119 Patient's noncompliance with other medical treatment and regimen: Secondary | ICD-10-CM

## 2018-09-05 DIAGNOSIS — K219 Gastro-esophageal reflux disease without esophagitis: Secondary | ICD-10-CM

## 2018-09-05 MED ORDER — PEG 3350-KCL-NABCB-NACL-NASULF 236 G PO SOLR
4000 mL | Freq: Once | ORAL | 0 refills | Status: CP
Start: 2018-09-05 — End: ?

## 2018-09-08 ENCOUNTER — Encounter: Primary: Family Medicine

## 2018-09-16 ENCOUNTER — Inpatient Hospital Stay: Admit: 2018-09-16 | Discharge: 2018-09-17 | Primary: Family Medicine

## 2018-09-16 ENCOUNTER — Encounter: Primary: Family Medicine

## 2018-09-19 ENCOUNTER — Ambulatory Visit

## 2018-09-19 DIAGNOSIS — G43909 Migraine, unspecified, not intractable, without status migrainosus: Secondary | ICD-10-CM

## 2018-09-19 DIAGNOSIS — S0510XA Contusion of eyeball and orbital tissues, unspecified eye, initial encounter: Secondary | ICD-10-CM

## 2018-09-19 DIAGNOSIS — K219 Gastro-esophageal reflux disease without esophagitis: Secondary | ICD-10-CM

## 2018-09-19 DIAGNOSIS — I10 Essential (primary) hypertension: Principal | ICD-10-CM

## 2018-09-19 DIAGNOSIS — Z9119 Patient's noncompliance with other medical treatment and regimen: Secondary | ICD-10-CM

## 2018-09-19 MED ORDER — LACTATED RINGERS IV SOLN
1000 mL | Freq: Once | INTRAVENOUS | Status: CP
Start: 2018-09-19 — End: ?

## 2018-09-19 MED ORDER — PROPOFOL 10 MG/ML IV EMUL CUSTOM SH
Status: DC | PRN
Start: 2018-09-19 — End: 2018-09-19

## 2018-09-19 MED ORDER — DEXMEDETOMIDINE HCL 200 MCG/2ML IV SOLN
Status: DC | PRN
Start: 2018-09-19 — End: 2018-09-19

## 2018-09-19 MED ORDER — PROPOFOL INFUSION JX
Status: DC | PRN
Start: 2018-09-19 — End: 2018-09-19

## 2018-09-19 MED ORDER — ONDANSETRON HCL 4 MG/2ML IJ SOLN
1 mg | INTRAVENOUS | Status: DC | PRN
Start: 2018-09-19 — End: 2018-09-19

## 2018-09-19 MED ORDER — LIDOCAINE HCL (PF) 1 % IJ SOLN SH
INTRAVENOUS | Status: DC | PRN
Start: 2018-09-19 — End: 2018-09-19

## 2018-09-20 DIAGNOSIS — Z9119 Patient's noncompliance with other medical treatment and regimen: Secondary | ICD-10-CM

## 2018-09-20 DIAGNOSIS — K219 Gastro-esophageal reflux disease without esophagitis: Secondary | ICD-10-CM

## 2018-09-20 DIAGNOSIS — I10 Essential (primary) hypertension: Principal | ICD-10-CM

## 2018-09-20 DIAGNOSIS — S0510XA Contusion of eyeball and orbital tissues, unspecified eye, initial encounter: Secondary | ICD-10-CM

## 2018-09-20 DIAGNOSIS — G43909 Migraine, unspecified, not intractable, without status migrainosus: Secondary | ICD-10-CM

## 2018-09-22 ENCOUNTER — Encounter: Attending: Ophthalmology | Primary: Family Medicine

## 2018-09-26 ENCOUNTER — Ambulatory Visit: Attending: Ophthalmology | Primary: Family Medicine

## 2018-09-26 DIAGNOSIS — G43909 Migraine, unspecified, not intractable, without status migrainosus: Secondary | ICD-10-CM

## 2018-09-26 DIAGNOSIS — Z9119 Patient's noncompliance with other medical treatment and regimen: Secondary | ICD-10-CM

## 2018-09-26 DIAGNOSIS — H35033 Hypertensive retinopathy, bilateral: Secondary | ICD-10-CM

## 2018-09-26 DIAGNOSIS — H524 Presbyopia: Secondary | ICD-10-CM

## 2018-09-26 DIAGNOSIS — I10 Essential (primary) hypertension: Principal | ICD-10-CM

## 2018-09-26 DIAGNOSIS — S0510XA Contusion of eyeball and orbital tissues, unspecified eye, initial encounter: Secondary | ICD-10-CM

## 2018-09-26 DIAGNOSIS — H40013 Open angle with borderline findings, low risk, bilateral: Principal | ICD-10-CM

## 2018-09-26 DIAGNOSIS — K219 Gastro-esophageal reflux disease without esophagitis: Secondary | ICD-10-CM

## 2018-10-06 ENCOUNTER — Encounter: Attending: Ophthalmology | Primary: Family Medicine

## 2018-10-19 ENCOUNTER — Emergency Department
Admission: EM | Admit: 2018-10-19 | Discharge: 2018-10-19 | Disposition: A | Discharge status: Home / Self Care | Payer: BC Managed Care – PPO | Attending: Emergency Medicine | Admitting: Emergency Medicine

## 2018-10-19 ENCOUNTER — Other Ambulatory Visit: Payer: Self-pay

## 2018-10-19 ENCOUNTER — Emergency Department: Payer: BC Managed Care – PPO

## 2018-10-19 ENCOUNTER — Encounter: Payer: Self-pay | Admitting: Emergency Medicine

## 2018-10-19 DIAGNOSIS — N3090 Cystitis, unspecified without hematuria: Secondary | ICD-10-CM | POA: Diagnosis not present

## 2018-10-19 DIAGNOSIS — I10 Essential (primary) hypertension: Secondary | ICD-10-CM | POA: Diagnosis not present

## 2018-10-19 DIAGNOSIS — M545 Low back pain, unspecified: Secondary | ICD-10-CM

## 2018-10-19 HISTORY — DX: Essential (primary) hypertension: I10

## 2018-10-19 LAB — URINALYSIS, COMPLETE (UACMP) WITH MICROSCOPIC
Bacteria, UA: NONE SEEN
Bilirubin Urine: NEGATIVE
Glucose, UA: NEGATIVE mg/dL
Hgb urine dipstick: NEGATIVE
Ketones, ur: NEGATIVE mg/dL
Nitrite: NEGATIVE
Protein, ur: NEGATIVE mg/dL
Specific Gravity, Urine: 1.029 (ref 1.005–1.030)
pH: 5 (ref 5.0–8.0)

## 2018-10-19 MED ORDER — MELOXICAM 15 MG PO TABS: 15.0000 mg | ORAL_TABLET | Freq: Every day | ORAL | 0 refills | Status: AC

## 2018-10-19 MED ORDER — METHOCARBAMOL 500 MG PO TABS: 500.0000 mg | ORAL_TABLET | Freq: Four times a day (QID) | ORAL | 0 refills | Status: AC

## 2018-10-19 MED ORDER — CEPHALEXIN 500 MG PO CAPS: 500.0000 mg | ORAL_CAPSULE | Freq: Four times a day (QID) | ORAL | 0 refills | Status: AC

## 2018-10-19 MED ORDER — MELOXICAM 7.5 MG PO TABS
15.0000 mg | ORAL_TABLET | Freq: Once | ORAL | Status: AC
  Administered 2018-10-19: 15 mg via ORAL
  Filled 2018-10-19: qty 2

## 2018-10-19 NOTE — ED Triage Notes (Signed)
Pt to ER with c/o bilateral back pain and dysuria for last 2 weeks.

## 2018-10-19 NOTE — ED Provider Notes (Signed)
[facility] Emergency Department Provider Note  ____________________________________________  Time seen: Approximately 8:05 PM  I have reviewed the triage vital signs and the nursing notes.   HISTORY  Chief Complaint Back Pain and Dysuria    HPI Denise Guzman is a 53 y.o. female who presents the emergency department complaining of low back pain, dysuria x2 weeks.  Patient reports that she has been trying to exercise more over the past 3 months.  Patient started to have low back pain 2 weeks ago.  Patient thought initially this was from her increased levels of activity, however she states that she is also developed dysuria.  Patient reports that her urine is dark, she seen a little bit of blood intermittently but not constantly.  Patient reports that she is postmenopausal and so this cannot be from a period.  Patient denies any fevers or chills.  No vaginal bleeding or discharge.  Patient denies any pelvic pain.  She states that the pain is "where my kidneys are."  No direct trauma to her back.  No other complaints at this time.         Past Medical History:  Diagnosis Date  . Hypertension     There are no active problems to display for this patient.   Past Surgical History:  Procedure Laterality Date  . ABDOMINAL HYSTERECTOMY      Prior to Admission medications   Medication Sig Start Date End Date Taking? Authorizing Provider  cephALEXin (KEFLEX) 500 MG capsule Take 1 capsule (500 mg total) by mouth 4 (four) times daily. 10/19/18   Cuthriell, Charline Bills, PA-C  meloxicam (MOBIC) 15 MG tablet Take 1 tablet (15 mg total) by mouth daily. 10/19/18   Cuthriell, Charline Bills, PA-C  methocarbamol (ROBAXIN) 500 MG tablet Take 1 tablet (500 mg total) by mouth 4 (four) times daily. 10/19/18   Cuthriell, Charline Bills, PA-C    Allergies Patient has no known allergies.  No family history on file.  Social History Social History   Tobacco Use  . Smoking status: Never  Smoker  . Smokeless tobacco: Never Used  Substance Use Topics  . Alcohol use: Never    Frequency: Never  . Drug use: Never     Review of Systems  Constitutional: No fever/chills Eyes: No visual changes. No discharge ENT: No upper respiratory complaints. Cardiovascular: no chest pain. Respiratory: no cough. No SOB. Gastrointestinal: No abdominal pain.  No nausea, no vomiting.  No diarrhea.  No constipation. Genitourinary: Positive for dysuria.  Mild intermittent hematuria.  Positive for bilateral flank pain. Musculoskeletal: Negative for musculoskeletal pain. Skin: Negative for rash, abrasions, lacerations, ecchymosis. Neurological: Negative for headaches, focal weakness or numbness. 10-point ROS otherwise negative.  ____________________________________________   PHYSICAL EXAM:  VITAL SIGNS: ED Triage Vitals [10/19/18 1908]  Enc Vitals Group     BP (!) 156/87     Pulse Rate 68     Resp 18     Temp 98.8 F (37.1 C)     Temp src      SpO2 96 %     Weight 226 lb (102.5 kg)     Height 5\' 3"  (1.6 m)     Head Circumference      Peak Flow      Pain Score 8     Pain Loc      Pain Edu?      Excl. in [place]?      Constitutional: Alert and oriented. Well appearing and in no  acute distress. Eyes: Conjunctivae are normal. PERRL. EOMI. Head: Atraumatic.  Neck: No stridor.    Cardiovascular: Normal rate, regular rhythm. Normal S1 and S2.  Good peripheral circulation. Respiratory: Normal respiratory effort without tachypnea or retractions. Lungs CTAB. Good air entry to the bases with no decreased or absent breath sounds. Gastrointestinal: Bowel sounds 4 quadrants. Soft and nontender to palpation. No guarding or rigidity. No palpable masses. No distention.  Bilateral CVA tenderness.  Worse on the right. Musculoskeletal: Full range of motion to all extremities. No gross deformities appreciated. Neurologic:  Normal speech and language. No gross focal neurologic deficits are  appreciated.  Skin:  Skin is warm, dry and intact. No rash noted. Psychiatric: Mood and affect are normal. Speech and behavior are normal. Patient exhibits appropriate insight and judgement.   ____________________________________________   LABS (all labs ordered are listed, but only abnormal results are displayed)  Labs Reviewed  URINALYSIS, COMPLETE (UACMP) WITH MICROSCOPIC - Abnormal; Notable for the following components:      Result Value   Color, Urine YELLOW (*)    APPearance CLOUDY (*)    Leukocytes,Ua LARGE (*)    All other components within normal limits   ____________________________________________  EKG   ____________________________________________  RADIOLOGY I personally viewed and evaluated these images as part of my medical decision making, as well as reviewing the written report by the radiologist.  Ct Renal Stone Study  Result Date: 10/19/2018 CLINICAL DATA:  53 year old female with bilateral flank pain and dysuria. EXAM: CT ABDOMEN AND PELVIS WITHOUT CONTRAST TECHNIQUE: Multidetector CT imaging of the abdomen and pelvis was performed following the standard protocol without IV contrast. COMPARISON:  None. FINDINGS: Evaluation of this exam is limited in the absence of intravenous contrast. Lower chest: The visualized lung bases are clear. No intra-abdominal free air or free fluid. Hepatobiliary: No focal liver abnormality is seen. No gallstones, gallbladder wall thickening, or biliary dilatation. Pancreas: Unremarkable. No pancreatic ductal dilatation or surrounding inflammatory changes. Spleen: Normal in size without focal abnormality. Adrenals/Urinary Tract: The adrenal glands are unremarkable. There is no hydronephrosis or nephrolithiasis on either side. There is a 3.5 cm left renal inferior pole hypodense lesion which is suboptimally characterized on this noncontrast CT but demonstrates fluid attenuation most consistent with a cyst. A 1 cm right renal hypodense lesion  likely represents a cyst. The right ureter and urinary bladder appear unremarkable. Stomach/Bowel: There is no bowel obstruction or active inflammation. The appendix is normal. Vascular/Lymphatic: The abdominal aorta and IVC are grossly unremarkable on this noncontrast CT. No portal venous gas. There is no adenopathy. Reproductive: Hysterectomy. A 12 x 16 mm calcification in the left lower abdomen is likely associated with the left adnexa and may represent a corpus albicans in the left ovary or vascular calcification. A left ureteral stone is much less likely. Pelvic ultrasound or CT with IV contrast in excretory phase may provide better evaluation. Other: Small fat containing umbilical hernia. Musculoskeletal: No acute or significant osseous findings. IMPRESSION: 1. No hydronephrosis or nephrolithiasis. 2. No bowel obstruction or active inflammation. Normal appendix. 3. A 12 x 16 mm calcification in the left lower abdomen is likely associated with the left adnexa and may represent a corpus albicans in the left ovary or vascular calcification. Pelvic ultrasound or CT with IV contrast in excretory phase may provide better evaluation. Electronically Signed   By: Elgie CollardArash  Radparvar M.D.   On: 10/19/2018 20:34    ____________________________________________    PROCEDURES  Procedure(s) performed:  Procedures    Medications  meloxicam (MOBIC) tablet 15 mg (has no administration in time range)     ____________________________________________   INITIAL IMPRESSION / ASSESSMENT AND PLAN / ED COURSE  Pertinent labs & imaging results that were available during my care of the patient were reviewed by me and considered in my medical decision making (see chart for details).  Review of the [place] CSRS was performed in accordance of the [facility] prior to dispensing any controlled drugs.           Patient's diagnosis is consistent with cystitis, acute mid low back pain without sciatica.  Patient presented  to the emergency department complaining of lower back pain with intermittent dysuria, intermittent hematuria x2 weeks.  Patient denies any trauma to her back.  No radicular symptoms.  No bowel or bladder function, saddle anesthesia or paresthesias.  Patient was concerned that this may be a kidney infection versus kidney stone.  Patient had had some intermittent dysuria, intermittent hematuria.  No fevers or chills.  Patient reports that she would go some days without any dysuria or hematuria, then it might return.  Urinalysis showed some leukocytes with no nitrites or bacteria.  CT scan revealed no evidence of pyelonephritis, nephrolithiasis.  Patient did have an incidental finding of corpus albicans.  This is incompletely visualized given CT without contrast.  I discussed the incidental finding and patient opts to follow-up with primary care for further evaluation of this.  Given patient's symptoms, I suspect that she has some mild cystitis with accompanying lower back pain.  Patient will be treated with anti-inflammatory and muscle relaxer for musculoskeletal pain, I advised the patient that I will prescribe an antibiotic but to wait 2 to 3 days.  If symptoms of dysuria persist, she should fill the antibiotic at that time.  Follow-up with primary care as needed and for follow-up on incidental finding on CT.Marland Kitchen  Patient is given ED precautions to return to the ED for any worsening or new symptoms.     ____________________________________________  FINAL CLINICAL IMPRESSION(S) / ED DIAGNOSES  Final diagnoses:  Cystitis  Acute midline low back pain without sciatica      NEW MEDICATIONS STARTED DURING THIS VISIT:  ED Discharge Orders         Ordered    meloxicam (MOBIC) 15 MG tablet  Daily     10/19/18 2143    methocarbamol (ROBAXIN) 500 MG tablet  4 times daily     10/19/18 2143    cephALEXin (KEFLEX) 500 MG capsule  4 times daily     10/19/18 2143              This chart was dictated  using voice recognition software/Dragon. Despite best efforts to proofread, errors can occur which can change the meaning. Any change was purely unintentional.    Racheal Patches, PA-C 10/19/18 2144    Chesley Noon, MD 10/19/18 2352

## 2018-11-01 ENCOUNTER — Encounter: Attending: Family Medicine | Primary: Family Medicine

## 2018-11-03 ENCOUNTER — Encounter: Primary: Family Medicine

## 2018-11-10 ENCOUNTER — Encounter: Attending: Family | Primary: Family Medicine

## 2018-11-14 DIAGNOSIS — Z8679 Personal history of other diseases of the circulatory system: Secondary | ICD-10-CM

## 2018-11-14 DIAGNOSIS — Z9114 Patient's other noncompliance with medication regimen: Principal | ICD-10-CM

## 2018-11-14 MED ORDER — HYDROCHLOROTHIAZIDE 25 MG PO TABS
25 mg | Freq: Every day | ORAL | 3 refills | Status: CP
Start: 2018-11-14 — End: ?

## 2018-11-14 MED ORDER — AMLODIPINE BESYLATE 10 MG PO TABS
10 mg | Freq: Every day | ORAL | 3 refills | Status: CP
Start: 2018-11-14 — End: ?

## 2018-11-17 ENCOUNTER — Telehealth: Attending: Family Medicine | Primary: Family Medicine

## 2018-11-17 DIAGNOSIS — G4733 Obstructive sleep apnea (adult) (pediatric): Secondary | ICD-10-CM

## 2018-11-17 DIAGNOSIS — I10 Essential (primary) hypertension: Principal | ICD-10-CM

## 2018-11-17 DIAGNOSIS — N83209 Unspecified ovarian cyst, unspecified side: Secondary | ICD-10-CM

## 2018-11-17 DIAGNOSIS — K21 Gastroesophageal reflux disease with esophagitis without hemorrhage: Secondary | ICD-10-CM

## 2018-11-17 DIAGNOSIS — K649 Unspecified hemorrhoids: Secondary | ICD-10-CM

## 2018-11-17 MED ORDER — TRAMADOL HCL 50 MG PO TABS
50 mg | Freq: Four times a day (QID) | ORAL | 0 refills | 13.50000 days | Status: CP | PRN
Start: 2018-11-17 — End: ?

## 2018-11-17 MED ORDER — HYDROCORTISONE VALERATE 0.2 % EX CREA
TOPICAL | 2 refills | Status: CP
Start: 2018-11-17 — End: ?

## 2018-11-24 ENCOUNTER — Inpatient Hospital Stay: Admit: 2018-11-24 | Discharge: 2018-11-25 | Primary: Family Medicine

## 2018-11-24 DIAGNOSIS — N83209 Unspecified ovarian cyst, unspecified side: Principal | ICD-10-CM

## 2018-12-12 ENCOUNTER — Telehealth: Attending: Family Medicine | Primary: Family Medicine

## 2018-12-12 ENCOUNTER — Encounter: Attending: Family Medicine | Primary: Family Medicine

## 2018-12-12 DIAGNOSIS — I10 Essential (primary) hypertension: Principal | ICD-10-CM

## 2018-12-12 DIAGNOSIS — T732XXS Exhaustion due to exposure, sequela: Secondary | ICD-10-CM

## 2018-12-12 DIAGNOSIS — K219 Gastro-esophageal reflux disease without esophagitis: Secondary | ICD-10-CM

## 2018-12-12 DIAGNOSIS — S0510XA Contusion of eyeball and orbital tissues, unspecified eye, initial encounter: Secondary | ICD-10-CM

## 2018-12-12 DIAGNOSIS — Z9119 Patient's noncompliance with other medical treatment and regimen: Secondary | ICD-10-CM

## 2018-12-12 DIAGNOSIS — G43909 Migraine, unspecified, not intractable, without status migrainosus: Secondary | ICD-10-CM

## 2018-12-12 DIAGNOSIS — H538 Other visual disturbances: Secondary | ICD-10-CM

## 2018-12-14 DIAGNOSIS — R4589 Other symptoms and signs involving emotional state: Secondary | ICD-10-CM

## 2018-12-14 MED ORDER — CITALOPRAM HYDROBROMIDE 20 MG PO TABS
20 mg | Freq: Every day | ORAL | 1 refills | Status: CP
Start: 2018-12-14 — End: ?

## 2018-12-14 MED ORDER — CITALOPRAM HYDROBROMIDE 20 MG PO TABS
20 mg | Freq: Every day | ORAL | 1 refills | Status: CN
Start: 2018-12-14 — End: ?

## 2018-12-14 MED ORDER — TRAMADOL HCL 50 MG PO TABS
50 mg | Freq: Four times a day (QID) | ORAL | 0 refills | 13.50000 days | Status: CP | PRN
Start: 2018-12-14 — End: ?

## 2018-12-14 MED ORDER — TRAMADOL HCL 50 MG PO TABS
50 mg | Freq: Four times a day (QID) | ORAL | 0 refills | Status: CN | PRN
Start: 2018-12-14 — End: ?

## 2018-12-22 ENCOUNTER — Encounter: Attending: Family Medicine | Primary: Family Medicine

## 2019-03-14 DIAGNOSIS — Z9114 Patient's other noncompliance with medication regimen: Principal | ICD-10-CM

## 2019-03-14 DIAGNOSIS — Z8679 Personal history of other diseases of the circulatory system: Secondary | ICD-10-CM

## 2019-03-14 MED ORDER — AMLODIPINE BESYLATE 10 MG PO TABS
10 mg | Freq: Every day | ORAL | 3 refills | Status: CP
Start: 2019-03-14 — End: ?

## 2019-03-22 ENCOUNTER — Ambulatory Visit

## 2019-03-22 DIAGNOSIS — B379 Candidiasis, unspecified: Secondary | ICD-10-CM

## 2019-03-22 DIAGNOSIS — N760 Acute vaginitis: Secondary | ICD-10-CM

## 2019-03-22 DIAGNOSIS — B9689 Other specified bacterial agents as the cause of diseases classified elsewhere: Secondary | ICD-10-CM

## 2019-03-22 DIAGNOSIS — I10 Essential (primary) hypertension: Principal | ICD-10-CM

## 2019-03-22 DIAGNOSIS — S0510XA Contusion of eyeball and orbital tissues, unspecified eye, initial encounter: Secondary | ICD-10-CM

## 2019-03-22 DIAGNOSIS — G43909 Migraine, unspecified, not intractable, without status migrainosus: Secondary | ICD-10-CM

## 2019-03-22 DIAGNOSIS — D649 Anemia, unspecified: Secondary | ICD-10-CM

## 2019-03-22 DIAGNOSIS — K219 Gastro-esophageal reflux disease without esophagitis: Secondary | ICD-10-CM

## 2019-03-22 DIAGNOSIS — M545 Low back pain: Secondary | ICD-10-CM

## 2019-03-22 DIAGNOSIS — Z9119 Patient's noncompliance with other medical treatment and regimen: Secondary | ICD-10-CM

## 2019-03-22 DIAGNOSIS — Z6841 Body Mass Index (BMI) 40.0 and over, adult: Principal | ICD-10-CM

## 2019-03-22 MED ORDER — FLUCONAZOLE 150 MG PO TABS
150 mg | Freq: Every day | ORAL | 1 refills | 8.00000 days | Status: CP
Start: 2019-03-22 — End: ?

## 2019-03-22 MED ORDER — DICLOFENAC SODIUM 75 MG PO TBEC
75 mg | Freq: Two times a day (BID) | ORAL | 1 refills | Status: CP
Start: 2019-03-22 — End: ?

## 2019-03-22 MED ORDER — METRONIDAZOLE 500 MG PO TABS
500 mg | Freq: Two times a day (BID) | ORAL | 0 refills | Status: CP
Start: 2019-03-22 — End: ?

## 2019-03-22 MED ORDER — METHOCARBAMOL 750 MG PO TABS
750 mg | ORAL
Start: 2019-03-22 — End: ?

## 2019-03-22 MED ORDER — ACETAMINOPHEN-CODEINE #3 300-30 MG PO TABS
1 | ORAL_TABLET | ORAL
Start: 2019-03-22 — End: ?

## 2019-03-29 ENCOUNTER — Encounter: Attending: Ophthalmology

## 2019-03-31 DIAGNOSIS — M62838 Other muscle spasm: Principal | ICD-10-CM

## 2019-03-31 MED ORDER — METHOCARBAMOL 750 MG PO TABS
750 mg | ORAL | Status: CN
Start: 2019-03-31 — End: ?

## 2019-03-31 MED ORDER — METHOCARBAMOL 750 MG PO TABS
750 mg | Freq: Two times a day (BID) | ORAL | 0 refills | Status: CP | PRN
Start: 2019-03-31 — End: ?

## 2019-04-25 ENCOUNTER — Ambulatory Visit: Payer: BLUE CROSS/BLUE SHIELD

## 2019-04-25 DIAGNOSIS — Z6841 Body Mass Index (BMI) 40.0 and over, adult: Principal | ICD-10-CM

## 2019-04-25 DIAGNOSIS — D649 Anemia, unspecified: Secondary | ICD-10-CM

## 2019-04-25 DIAGNOSIS — K219 Gastro-esophageal reflux disease without esophagitis: Secondary | ICD-10-CM

## 2019-04-25 DIAGNOSIS — G43909 Migraine, unspecified, not intractable, without status migrainosus: Secondary | ICD-10-CM

## 2019-04-25 DIAGNOSIS — Z9119 Patient's noncompliance with other medical treatment and regimen: Secondary | ICD-10-CM

## 2019-04-25 DIAGNOSIS — I10 Essential (primary) hypertension: Principal | ICD-10-CM

## 2019-04-25 DIAGNOSIS — S0510XA Contusion of eyeball and orbital tissues, unspecified eye, initial encounter: Secondary | ICD-10-CM

## 2019-07-02 DIAGNOSIS — R4589 Other symptoms and signs involving emotional state: Principal | ICD-10-CM

## 2019-07-03 MED ORDER — CITALOPRAM HYDROBROMIDE 20 MG PO TABS
20 mg | Freq: Every day | ORAL | 1 refills | Status: CP
Start: 2019-07-03 — End: ?

## 2019-07-11 ENCOUNTER — Ambulatory Visit: Admit: 2019-07-11 | Discharge: 2019-07-11

## 2019-07-18 ENCOUNTER — Ambulatory Visit: Admit: 2019-07-18 | Discharge: 2019-07-18

## 2019-07-18 DIAGNOSIS — K219 Gastro-esophageal reflux disease without esophagitis: Secondary | ICD-10-CM

## 2019-07-18 DIAGNOSIS — R635 Abnormal weight gain: Secondary | ICD-10-CM

## 2019-07-18 DIAGNOSIS — G43909 Migraine, unspecified, not intractable, without status migrainosus: Secondary | ICD-10-CM

## 2019-07-18 DIAGNOSIS — Z1159 Encounter for screening for other viral diseases: Secondary | ICD-10-CM

## 2019-07-18 DIAGNOSIS — E785 Hyperlipidemia, unspecified: Secondary | ICD-10-CM

## 2019-07-18 DIAGNOSIS — D649 Anemia, unspecified: Secondary | ICD-10-CM

## 2019-07-18 DIAGNOSIS — Z9119 Patient's noncompliance with other medical treatment and regimen: Secondary | ICD-10-CM

## 2019-07-18 DIAGNOSIS — R739 Hyperglycemia, unspecified: Secondary | ICD-10-CM

## 2019-07-18 DIAGNOSIS — S0510XA Contusion of eyeball and orbital tissues, unspecified eye, initial encounter: Secondary | ICD-10-CM

## 2019-07-18 DIAGNOSIS — I10 Essential (primary) hypertension: Principal | ICD-10-CM

## 2019-07-18 DIAGNOSIS — Z6841 Body Mass Index (BMI) 40.0 and over, adult: Secondary | ICD-10-CM

## 2019-07-18 DIAGNOSIS — Z0000 Encounter for general adult medical examination without abnormal findings: Principal | ICD-10-CM

## 2019-07-18 DIAGNOSIS — Z23 Encounter for immunization: Secondary | ICD-10-CM

## 2019-07-18 MED ORDER — ZOSTER VAC RECOMB ADJUVANTED 50 MCG/0.5ML IM SUSR
.5 mL | Freq: Once | INTRAMUSCULAR | 0 refills | Status: CP
Start: 2019-07-18 — End: ?

## 2019-07-28 DIAGNOSIS — Z6841 Body Mass Index (BMI) 40.0 and over, adult: Principal | ICD-10-CM

## 2019-07-28 DIAGNOSIS — G43909 Migraine, unspecified, not intractable, without status migrainosus: Secondary | ICD-10-CM

## 2019-07-28 DIAGNOSIS — L02231 Carbuncle of abdominal wall: Secondary | ICD-10-CM

## 2019-07-28 DIAGNOSIS — D649 Anemia, unspecified: Secondary | ICD-10-CM

## 2019-07-28 DIAGNOSIS — K219 Gastro-esophageal reflux disease without esophagitis: Secondary | ICD-10-CM

## 2019-07-28 DIAGNOSIS — S0510XA Contusion of eyeball and orbital tissues, unspecified eye, initial encounter: Secondary | ICD-10-CM

## 2019-07-28 DIAGNOSIS — Z9119 Patient's noncompliance with other medical treatment and regimen: Secondary | ICD-10-CM

## 2019-07-28 DIAGNOSIS — I10 Essential (primary) hypertension: Principal | ICD-10-CM

## 2019-07-28 MED ORDER — SULFAMETHOXAZOLE-TRIMETHOPRIM 800-160 MG PO TABS
1 | ORAL_TABLET | Freq: Two times a day (BID) | ORAL | 0 refills | Status: CP
Start: 2019-07-28 — End: ?

## 2019-07-28 MED ORDER — MUPIROCIN 2 % EX OINT
TOPICAL | o refills | 14.00000 days | Status: CP
Start: 2019-07-28 — End: ?

## 2019-08-01 DIAGNOSIS — R739 Hyperglycemia, unspecified: Principal | ICD-10-CM

## 2019-08-09 DIAGNOSIS — Z1231 Encounter for screening mammogram for malignant neoplasm of breast: Principal | ICD-10-CM

## 2019-08-11 ENCOUNTER — Ambulatory Visit: Admit: 2019-08-11 | Discharge: 2019-08-11

## 2019-08-15 ENCOUNTER — Ambulatory Visit: Admit: 2019-08-15 | Discharge: 2019-08-15 | Attending: Medical

## 2019-08-15 DIAGNOSIS — R928 Other abnormal and inconclusive findings on diagnostic imaging of breast: Principal | ICD-10-CM

## 2019-08-21 ENCOUNTER — Ambulatory Visit: Admit: 2019-08-21 | Discharge: 2019-08-21

## 2019-08-21 DIAGNOSIS — R928 Other abnormal and inconclusive findings on diagnostic imaging of breast: Principal | ICD-10-CM

## 2019-08-22 ENCOUNTER — Ambulatory Visit: Admit: 2019-08-22 | Discharge: 2019-08-22

## 2019-08-22 DIAGNOSIS — Z9119 Patient's noncompliance with other medical treatment and regimen: Secondary | ICD-10-CM

## 2019-08-22 DIAGNOSIS — N939 Abnormal uterine and vaginal bleeding, unspecified: Secondary | ICD-10-CM

## 2019-08-22 DIAGNOSIS — K219 Gastro-esophageal reflux disease without esophagitis: Secondary | ICD-10-CM

## 2019-08-22 DIAGNOSIS — S0510XA Contusion of eyeball and orbital tissues, unspecified eye, initial encounter: Secondary | ICD-10-CM

## 2019-08-22 DIAGNOSIS — D649 Anemia, unspecified: Secondary | ICD-10-CM

## 2019-08-22 DIAGNOSIS — I10 Essential (primary) hypertension: Principal | ICD-10-CM

## 2019-08-22 DIAGNOSIS — Z6841 Body Mass Index (BMI) 40.0 and over, adult: Principal | ICD-10-CM

## 2019-08-22 DIAGNOSIS — G43909 Migraine, unspecified, not intractable, without status migrainosus: Secondary | ICD-10-CM

## 2019-08-22 MED ORDER — ESTROGENS, CONJUGATED 0.625 MG/GM VA CREA
.5 g | Freq: Every day | VAGINAL | 1 refills | 161.00000 days | Status: CP
Start: 2019-08-22 — End: ?

## 2019-08-31 DIAGNOSIS — N3000 Acute cystitis without hematuria: Principal | ICD-10-CM

## 2019-08-31 DIAGNOSIS — Z9119 Patient's noncompliance with other medical treatment and regimen: Secondary | ICD-10-CM

## 2019-08-31 DIAGNOSIS — R300 Dysuria: Secondary | ICD-10-CM

## 2019-08-31 DIAGNOSIS — K219 Gastro-esophageal reflux disease without esophagitis: Secondary | ICD-10-CM

## 2019-08-31 DIAGNOSIS — S0510XA Contusion of eyeball and orbital tissues, unspecified eye, initial encounter: Secondary | ICD-10-CM

## 2019-08-31 DIAGNOSIS — I10 Essential (primary) hypertension: Principal | ICD-10-CM

## 2019-08-31 DIAGNOSIS — Z6841 Body Mass Index (BMI) 40.0 and over, adult: Secondary | ICD-10-CM

## 2019-08-31 DIAGNOSIS — D649 Anemia, unspecified: Secondary | ICD-10-CM

## 2019-08-31 DIAGNOSIS — G43909 Migraine, unspecified, not intractable, without status migrainosus: Secondary | ICD-10-CM

## 2019-08-31 MED ORDER — NITROFURANTOIN MONOHYD MACRO 100 MG PO CAPS
100 mg | Freq: Two times a day (BID) | ORAL | 0 refills | 7.00 days | Status: CP
Start: 2019-08-31 — End: ?

## 2019-11-27 ENCOUNTER — Encounter: Attending: Medical

## 2019-12-05 DIAGNOSIS — Z9114 Patient's other noncompliance with medication regimen: Principal | ICD-10-CM

## 2019-12-05 DIAGNOSIS — Z8679 Personal history of other diseases of the circulatory system: Secondary | ICD-10-CM

## 2019-12-05 MED ORDER — HYDROCHLOROTHIAZIDE 25 MG PO TABS
25 mg | Freq: Every day | ORAL | 3 refills | Status: CP
Start: 2019-12-05 — End: ?

## 2019-12-18 ENCOUNTER — Encounter: Attending: Family Medicine

## 2019-12-19 ENCOUNTER — Encounter: Attending: Family

## 2020-01-18 ENCOUNTER — Encounter: Attending: Nurse Practitioner

## 2020-01-18 DIAGNOSIS — Z23 Encounter for immunization: Principal | ICD-10-CM

## 2020-02-20 ENCOUNTER — Encounter: Primary: Family Medicine

## 2020-02-20 ENCOUNTER — Encounter: Attending: Medical | Primary: Family Medicine

## 2020-04-18 DIAGNOSIS — Z9114 Patient's other noncompliance with medication regimen: Principal | ICD-10-CM

## 2020-04-18 DIAGNOSIS — Z8679 Personal history of other diseases of the circulatory system: Secondary | ICD-10-CM

## 2020-04-18 MED ORDER — AMLODIPINE BESYLATE 10 MG PO TABS
10 mg | Freq: Every day | ORAL | 3 refills | Status: CN
Start: 2020-04-18 — End: ?

## 2020-04-25 ENCOUNTER — Encounter: Attending: Family | Primary: Family Medicine

## 2020-05-06 ENCOUNTER — Ambulatory Visit

## 2020-05-06 DIAGNOSIS — I10 Essential (primary) hypertension: Secondary | ICD-10-CM

## 2020-05-06 DIAGNOSIS — Z6841 Body Mass Index (BMI) 40.0 and over, adult: Secondary | ICD-10-CM

## 2020-05-06 DIAGNOSIS — K921 Melena: Principal | ICD-10-CM

## 2020-05-06 DIAGNOSIS — L0100 Impetigo, unspecified: Secondary | ICD-10-CM

## 2020-05-06 DIAGNOSIS — G4733 Obstructive sleep apnea (adult) (pediatric): Secondary | ICD-10-CM

## 2020-05-06 DIAGNOSIS — N83209 Unspecified ovarian cyst, unspecified side: Secondary | ICD-10-CM

## 2020-05-06 DIAGNOSIS — G43909 Migraine, unspecified, not intractable, without status migrainosus: Secondary | ICD-10-CM

## 2020-05-06 DIAGNOSIS — Z9114 Patient's other noncompliance with medication regimen: Secondary | ICD-10-CM

## 2020-05-06 DIAGNOSIS — R195 Other fecal abnormalities: Secondary | ICD-10-CM

## 2020-05-06 DIAGNOSIS — D649 Anemia, unspecified: Secondary | ICD-10-CM

## 2020-05-06 DIAGNOSIS — Z8679 Personal history of other diseases of the circulatory system: Secondary | ICD-10-CM

## 2020-05-06 DIAGNOSIS — M545 Acute bilateral low back pain without sciatica: Secondary | ICD-10-CM

## 2020-05-06 DIAGNOSIS — Z800 Family history of malignant neoplasm of digestive organs: Secondary | ICD-10-CM

## 2020-05-06 DIAGNOSIS — R5381 Other malaise: Secondary | ICD-10-CM

## 2020-05-06 DIAGNOSIS — M62838 Other muscle spasm: Secondary | ICD-10-CM

## 2020-05-06 DIAGNOSIS — S0510XA Contusion of eyeball and orbital tissues, unspecified eye, initial encounter: Secondary | ICD-10-CM

## 2020-05-06 DIAGNOSIS — K219 Gastro-esophageal reflux disease without esophagitis: Secondary | ICD-10-CM

## 2020-05-06 DIAGNOSIS — R4589 Other symptoms and signs involving emotional state: Secondary | ICD-10-CM

## 2020-05-06 DIAGNOSIS — K21 GERD with esophagitis: Secondary | ICD-10-CM

## 2020-05-06 DIAGNOSIS — J301 Allergic rhinitis due to pollen: Secondary | ICD-10-CM

## 2020-05-06 DIAGNOSIS — D229 Melanocytic nevi, unspecified: Secondary | ICD-10-CM

## 2020-05-06 DIAGNOSIS — Z9119 Patient's noncompliance with other medical treatment and regimen: Secondary | ICD-10-CM

## 2020-05-06 DIAGNOSIS — R5383 Other fatigue: Secondary | ICD-10-CM

## 2020-05-06 MED ORDER — AMLODIPINE BESYLATE 10 MG PO TABS
10 mg | Freq: Every day | ORAL | 3 refills | Status: CP
Start: 2020-05-06 — End: ?

## 2020-05-06 MED ORDER — CITALOPRAM HYDROBROMIDE 20 MG PO TABS
20 mg | Freq: Every day | ORAL | 1 refills | Status: CP
Start: 2020-05-06 — End: ?

## 2020-05-06 MED ORDER — HYDROCHLOROTHIAZIDE 25 MG PO TABS
25 mg | Freq: Every day | ORAL | 3 refills | Status: CP
Start: 2020-05-06 — End: ?

## 2020-05-08 DIAGNOSIS — I10 Essential (primary) hypertension: Secondary | ICD-10-CM

## 2020-05-08 DIAGNOSIS — K921 Melena: Principal | ICD-10-CM

## 2020-05-13 ENCOUNTER — Encounter

## 2020-05-21 ENCOUNTER — Ambulatory Visit: Admit: 2020-05-21 | Discharge: 2020-05-22 | Attending: Internal Medicine

## 2020-05-21 DIAGNOSIS — M545 Acute bilateral low back pain without sciatica: Secondary | ICD-10-CM

## 2020-05-21 DIAGNOSIS — Z9119 Patient's noncompliance with other medical treatment and regimen: Secondary | ICD-10-CM

## 2020-05-21 DIAGNOSIS — K921 Melena: Secondary | ICD-10-CM

## 2020-05-21 DIAGNOSIS — Z800 Family history of malignant neoplasm of digestive organs: Secondary | ICD-10-CM

## 2020-05-21 DIAGNOSIS — K219 Gastro-esophageal reflux disease without esophagitis: Secondary | ICD-10-CM

## 2020-05-21 DIAGNOSIS — K21 GERD with esophagitis: Secondary | ICD-10-CM

## 2020-05-21 DIAGNOSIS — M62838 Other muscle spasm: Secondary | ICD-10-CM

## 2020-05-21 DIAGNOSIS — K5900 Constipation, unspecified: Secondary | ICD-10-CM

## 2020-05-21 DIAGNOSIS — R195 Other fecal abnormalities: Secondary | ICD-10-CM

## 2020-05-21 DIAGNOSIS — J301 Allergic rhinitis due to pollen: Secondary | ICD-10-CM

## 2020-05-21 DIAGNOSIS — S0510XA Contusion of eyeball and orbital tissues, unspecified eye, initial encounter: Secondary | ICD-10-CM

## 2020-05-21 DIAGNOSIS — D229 Melanocytic nevi, unspecified: Secondary | ICD-10-CM

## 2020-05-21 DIAGNOSIS — I10 Essential (primary) hypertension: Principal | ICD-10-CM

## 2020-05-21 DIAGNOSIS — G4733 Obstructive sleep apnea (adult) (pediatric): Secondary | ICD-10-CM

## 2020-05-21 DIAGNOSIS — G43909 Migraine, unspecified, not intractable, without status migrainosus: Secondary | ICD-10-CM

## 2020-05-21 DIAGNOSIS — N83209 Unspecified ovarian cyst, unspecified side: Secondary | ICD-10-CM

## 2020-05-21 DIAGNOSIS — Z791 Long term (current) use of non-steroidal anti-inflammatories (NSAID): Secondary | ICD-10-CM

## 2020-05-21 DIAGNOSIS — R1030 Lower abdominal pain, unspecified: Secondary | ICD-10-CM

## 2020-05-21 DIAGNOSIS — L0100 Impetigo, unspecified: Secondary | ICD-10-CM

## 2020-05-21 DIAGNOSIS — D649 Anemia, unspecified: Secondary | ICD-10-CM

## 2020-05-21 MED ORDER — NALOXONE HCL 0.4 MG/ML IJ SOLN
.2 mg | INTRAVENOUS | PRN
Start: 2020-05-21 — End: ?

## 2020-05-21 MED ORDER — PEG 3350-KCL-NABCB-NACL-NASULF 236 G PO SOLR
0 refills | Status: CP
Start: 2020-05-21 — End: ?

## 2020-05-21 MED ORDER — POLYETHYLENE GLYCOL 3350 17 GM/SCOOP PO POWD
17 g | Freq: Two times a day (BID) | ORAL | 0 refills | 30.00000 days | Status: CP
Start: 2020-05-21 — End: ?

## 2020-05-21 MED ORDER — OMEPRAZOLE 40 MG PO CPDR
40 mg | Freq: Every morning | ORAL | 1 refills | 60.00000 days | Status: CP
Start: 2020-05-21 — End: ?

## 2020-06-03 ENCOUNTER — Encounter

## 2020-06-13 ENCOUNTER — Inpatient Hospital Stay

## 2020-06-21 ENCOUNTER — Encounter

## 2020-07-15 ENCOUNTER — Encounter

## 2020-08-09 ENCOUNTER — Encounter

## 2020-08-21 IMAGING — CT CT RENAL STONE PROTOCOL
2 of 4 series · 16 of 46 positions shown, 18 images · non-contrast
Comparison: None.

CLINICAL DATA: 53-year-old female with bilateral flank pain and
dysuria.

EXAM:
CT ABDOMEN AND PELVIS WITHOUT CONTRAST
TECHNIQUE: Multidetector CT imaging of the abdomen and pelvis was performed
following the standard protocol without IV contrast.

[Series 2: stone full standard · axial · 0.88mm/px · z∈[-440,-26]mm · 13 of 91 slices shown, 15 images]
[im 4/91  soft-tissue]
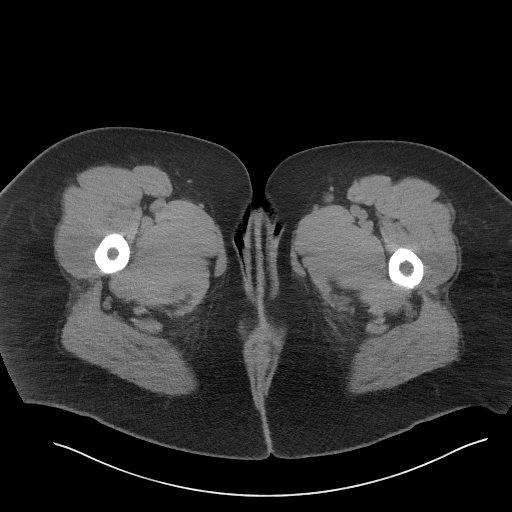
[im 4/91  bone]
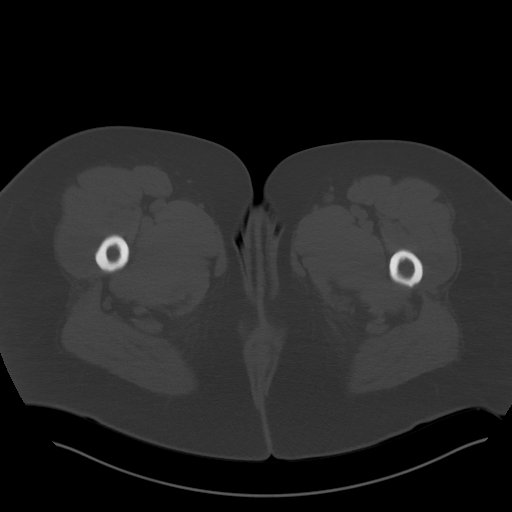
[im 11/91  soft-tissue]
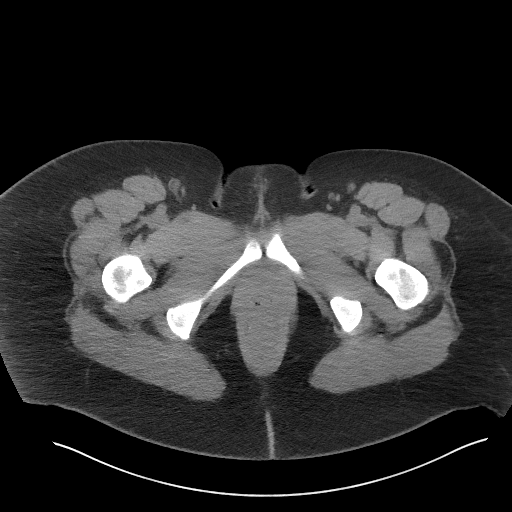
[im 19/91  soft-tissue]
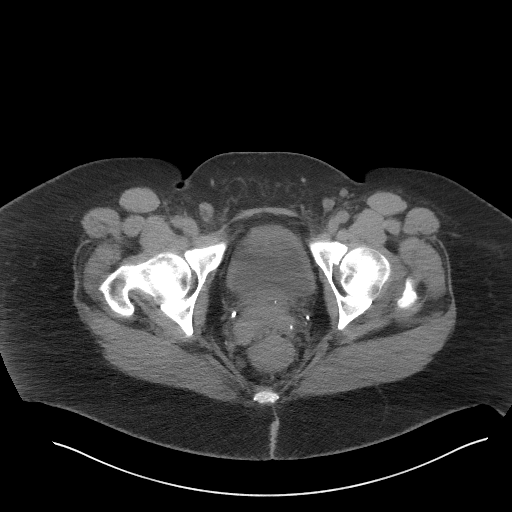
[im 26/91  soft-tissue]
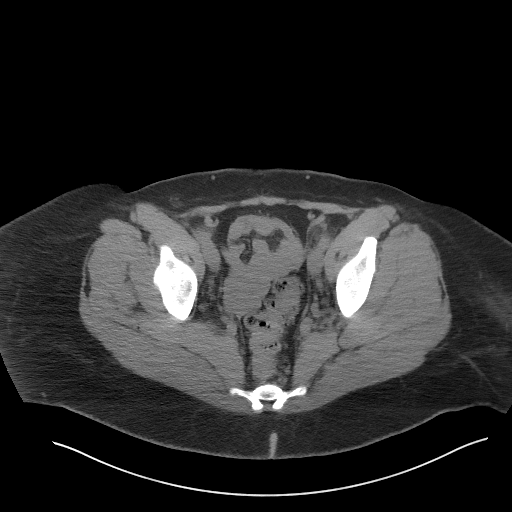
[im 33/91  soft-tissue]
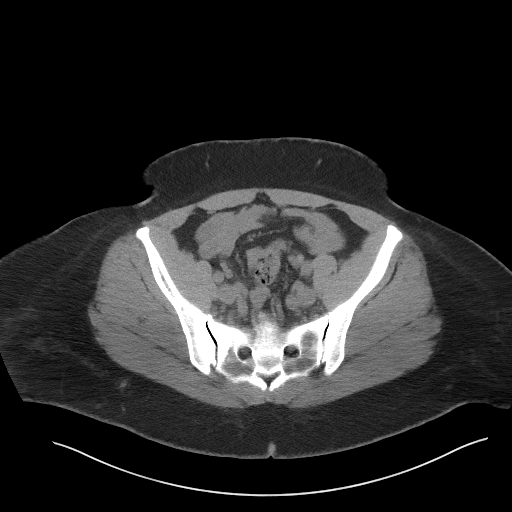
[im 40/91  soft-tissue]
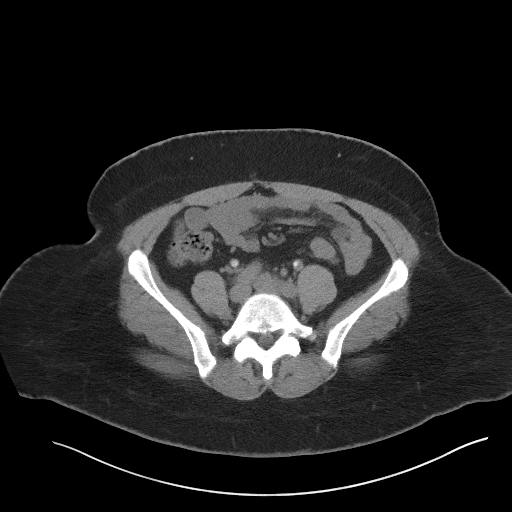
[im 47/91  soft-tissue]
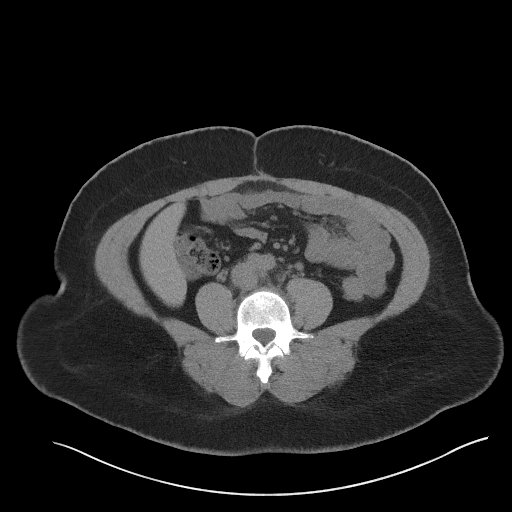
[im 51/91  soft-tissue]
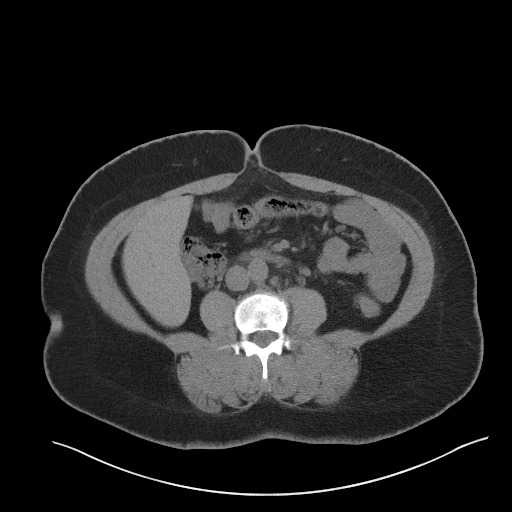
[im 58/91  soft-tissue]
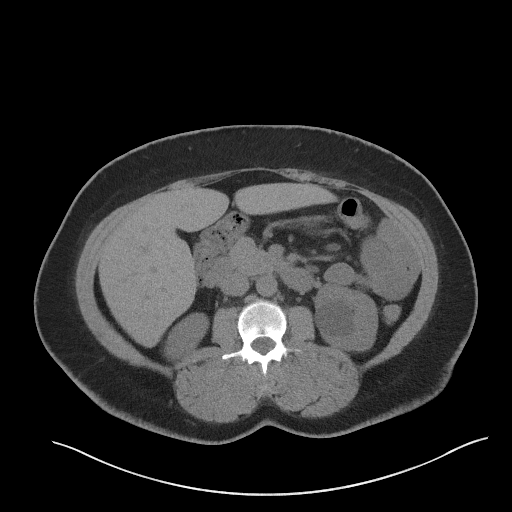
[im 58/91  bone]
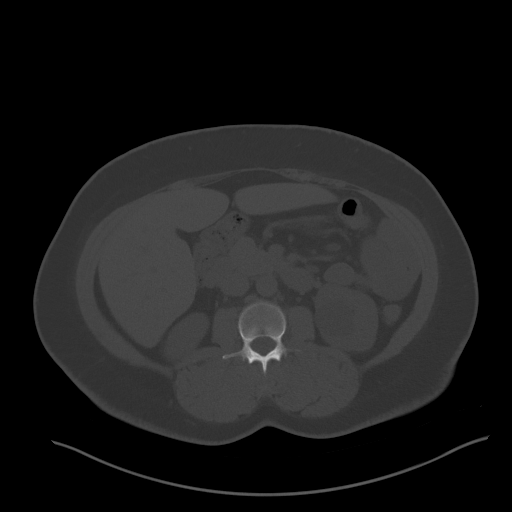
[im 65/91  soft-tissue]
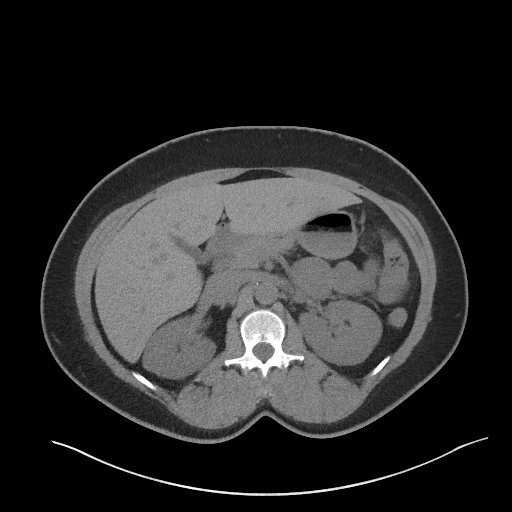
[im 73/91  soft-tissue]
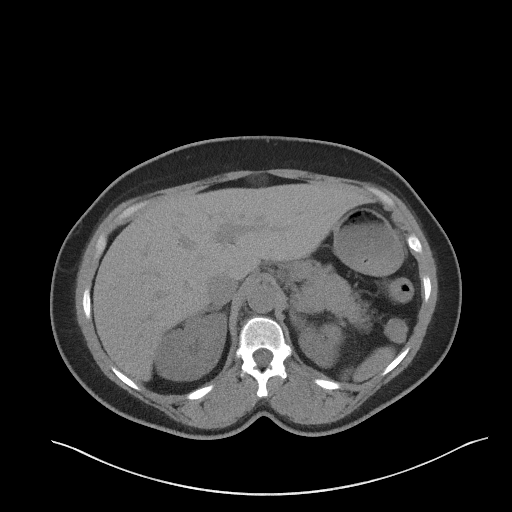
[im 80/91  soft-tissue]
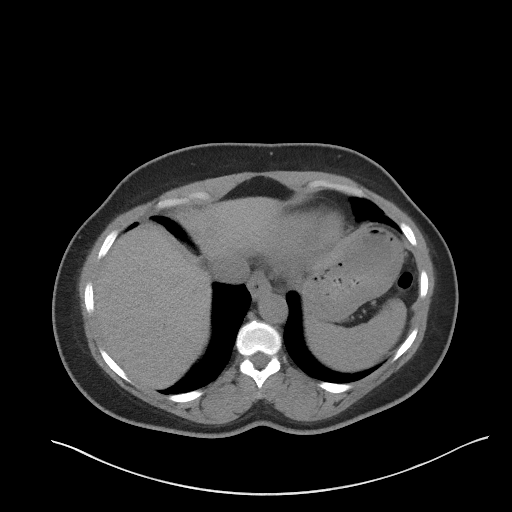
[im 87/91  soft-tissue]
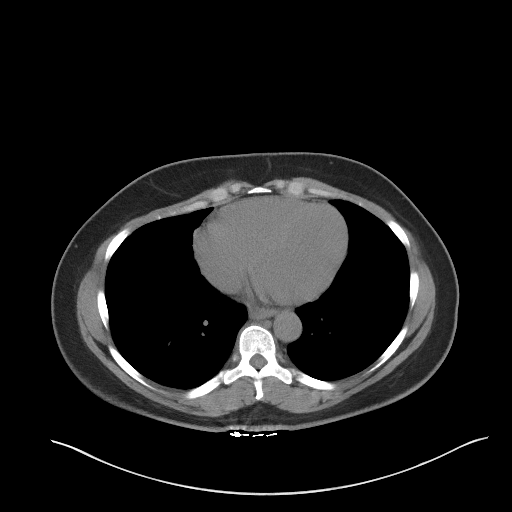

[Series 5: coronal · coronal · 0.89mm/px · 3 of 144 slices shown]
[im 48/144  soft-tissue]
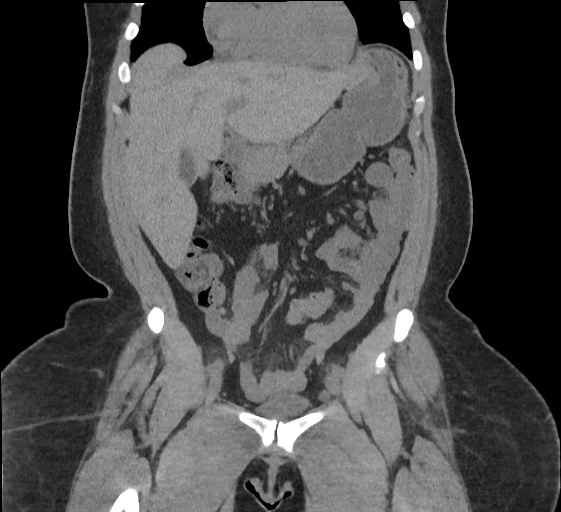
[im 64/144  soft-tissue]
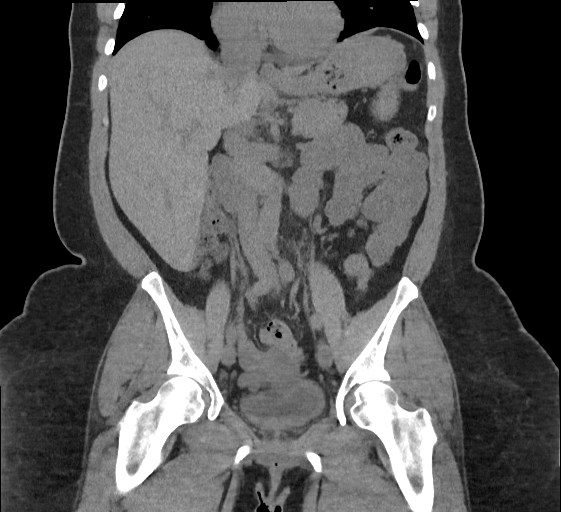
[im 80/144  soft-tissue]
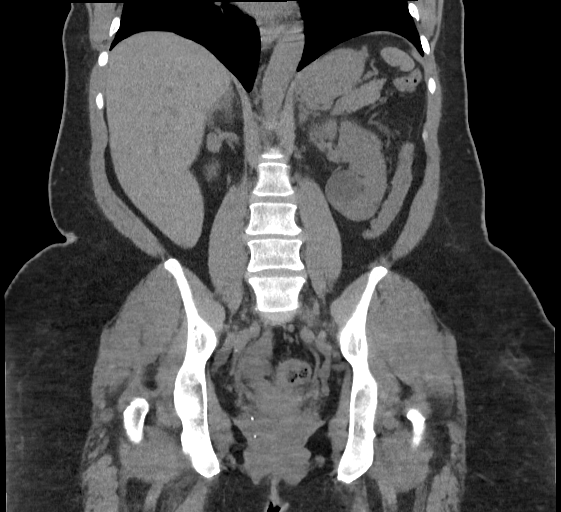

[16 of 46 positions shown; findings below may reference images not displayed]

FINDINGS: Evaluation of this exam is limited in the absence of intravenous
contrast.

Lower chest: The visualized lung bases are clear.

No intra-abdominal free air or free fluid.

Hepatobiliary: No focal liver abnormality is seen. No gallstones,
gallbladder wall thickening, or biliary dilatation.

Pancreas: Unremarkable. No pancreatic ductal dilatation or
surrounding inflammatory changes.

Spleen: Normal in size without focal abnormality.

Adrenals/Urinary Tract: The adrenal glands are unremarkable. There
is no hydronephrosis or nephrolithiasis on either side. There is a
3.5 cm left renal inferior pole hypodense lesion which is
suboptimally characterized on this noncontrast CT but demonstrates
fluid attenuation most consistent with a cyst. A 1 cm right renal
hypodense lesion likely represents a cyst. The right ureter and
urinary bladder appear unremarkable.

Stomach/Bowel: There is no bowel obstruction or active inflammation.
The appendix is normal.

Vascular/Lymphatic: The abdominal aorta and IVC are grossly
unremarkable on this noncontrast CT. No portal venous gas. There is
no adenopathy.

Reproductive: Hysterectomy. A 12 x 16 mm calcification in the left
lower abdomen is likely associated with the left adnexa and may
represent a corpus albicans in the left ovary or vascular
calcification. A left ureteral stone is much less likely. Pelvic
ultrasound or CT with IV contrast in excretory phase may provide
better evaluation.

Other: Small fat containing umbilical hernia.

Musculoskeletal: No acute or significant osseous findings.
IMPRESSION: 1. No hydronephrosis or nephrolithiasis.
2. No bowel obstruction or active inflammation. Normal appendix.
3. A 12 x 16 mm calcification in the left lower abdomen is likely
associated with the left adnexa and may represent a corpus albicans
in the left ovary or vascular calcification. Pelvic ultrasound or CT
with IV contrast in excretory phase may provide better evaluation.

## 2020-09-02 ENCOUNTER — Encounter

## 2020-09-06 ENCOUNTER — Inpatient Hospital Stay

## 2020-09-24 ENCOUNTER — Encounter: Attending: Internal Medicine

## 2020-10-04 ENCOUNTER — Encounter

## 2020-10-08 ENCOUNTER — Encounter: Attending: Internal Medicine

## 2020-10-15 ENCOUNTER — Ambulatory Visit

## 2020-10-15 DIAGNOSIS — M545 Acute bilateral low back pain without sciatica: Secondary | ICD-10-CM

## 2020-10-15 DIAGNOSIS — N83209 Unspecified ovarian cyst, unspecified side: Secondary | ICD-10-CM

## 2020-10-15 DIAGNOSIS — R195 Other fecal abnormalities: Secondary | ICD-10-CM

## 2020-10-15 DIAGNOSIS — H539 Unspecified visual disturbance: Secondary | ICD-10-CM

## 2020-10-15 DIAGNOSIS — I10 Essential (primary) hypertension: Principal | ICD-10-CM

## 2020-10-15 DIAGNOSIS — N939 Abnormal uterine and vaginal bleeding, unspecified: Principal | ICD-10-CM

## 2020-10-15 DIAGNOSIS — G43909 Migraine, unspecified, not intractable, without status migrainosus: Secondary | ICD-10-CM

## 2020-10-15 DIAGNOSIS — M62838 Other muscle spasm: Secondary | ICD-10-CM

## 2020-10-15 DIAGNOSIS — K219 Gastro-esophageal reflux disease without esophagitis: Secondary | ICD-10-CM

## 2020-10-15 DIAGNOSIS — Z13220 Encounter for screening for lipoid disorders: Secondary | ICD-10-CM

## 2020-10-15 DIAGNOSIS — K21 GERD with esophagitis: Secondary | ICD-10-CM

## 2020-10-15 DIAGNOSIS — L0100 Impetigo, unspecified: Secondary | ICD-10-CM

## 2020-10-15 DIAGNOSIS — D649 Anemia, unspecified: Secondary | ICD-10-CM

## 2020-10-15 DIAGNOSIS — D229 Melanocytic nevi, unspecified: Secondary | ICD-10-CM

## 2020-10-15 DIAGNOSIS — R1011 Right upper quadrant pain: Secondary | ICD-10-CM

## 2020-10-15 DIAGNOSIS — S0510XA Contusion of eyeball and orbital tissues, unspecified eye, initial encounter: Secondary | ICD-10-CM

## 2020-10-15 DIAGNOSIS — J301 Allergic rhinitis due to pollen: Secondary | ICD-10-CM

## 2020-10-15 DIAGNOSIS — Z9119 Patient's noncompliance with other medical treatment and regimen: Secondary | ICD-10-CM

## 2020-10-15 DIAGNOSIS — G4733 Obstructive sleep apnea (adult) (pediatric): Secondary | ICD-10-CM

## 2020-10-15 DIAGNOSIS — Z800 Family history of malignant neoplasm of digestive organs: Secondary | ICD-10-CM

## 2020-10-15 DIAGNOSIS — Z6841 Body Mass Index (BMI) 40.0 and over, adult: Secondary | ICD-10-CM

## 2020-10-15 DIAGNOSIS — R519 Nonintractable headache, unspecified chronicity pattern, unspecified headache type: Secondary | ICD-10-CM

## 2020-10-17 DIAGNOSIS — Z1231 Encounter for screening mammogram for malignant neoplasm of breast: Principal | ICD-10-CM

## 2020-10-24 ENCOUNTER — Encounter

## 2020-11-11 ENCOUNTER — Encounter

## 2020-11-12 ENCOUNTER — Encounter

## 2020-11-23 DIAGNOSIS — Z9114 Patient's other noncompliance with medication regimen: Secondary | ICD-10-CM

## 2020-11-23 DIAGNOSIS — R4589 Other symptoms and signs involving emotional state: Principal | ICD-10-CM

## 2020-11-23 DIAGNOSIS — Z8679 Personal history of other diseases of the circulatory system: Secondary | ICD-10-CM

## 2020-11-25 ENCOUNTER — Inpatient Hospital Stay: Admit: 2020-11-25 | Discharge: 2020-11-26

## 2020-11-25 DIAGNOSIS — M545 Acute bilateral low back pain without sciatica: Secondary | ICD-10-CM

## 2020-11-25 DIAGNOSIS — D229 Melanocytic nevi, unspecified: Secondary | ICD-10-CM

## 2020-11-25 DIAGNOSIS — S0510XA Contusion of eyeball and orbital tissues, unspecified eye, initial encounter: Secondary | ICD-10-CM

## 2020-11-25 DIAGNOSIS — R1011 Right upper quadrant pain: Principal | ICD-10-CM

## 2020-11-25 DIAGNOSIS — Z91199 Personal history of noncompliance with medical treatment, presenting hazards to health: Secondary | ICD-10-CM

## 2020-11-25 DIAGNOSIS — Z800 Family history of malignant neoplasm of digestive organs: Secondary | ICD-10-CM

## 2020-11-25 DIAGNOSIS — D649 Anemia, unspecified: Secondary | ICD-10-CM

## 2020-11-25 DIAGNOSIS — J301 Allergic rhinitis due to pollen: Secondary | ICD-10-CM

## 2020-11-25 DIAGNOSIS — N939 Abnormal uterine and vaginal bleeding, unspecified: Principal | ICD-10-CM

## 2020-11-25 DIAGNOSIS — G43909 Migraine, unspecified, not intractable, without status migrainosus: Secondary | ICD-10-CM

## 2020-11-25 DIAGNOSIS — M62838 Other muscle spasm: Secondary | ICD-10-CM

## 2020-11-25 DIAGNOSIS — I10 Essential (primary) hypertension: Principal | ICD-10-CM

## 2020-11-25 DIAGNOSIS — N83209 Unspecified ovarian cyst, unspecified side: Secondary | ICD-10-CM

## 2020-11-25 DIAGNOSIS — K219 Gastro-esophageal reflux disease without esophagitis: Secondary | ICD-10-CM

## 2020-11-25 DIAGNOSIS — L0100 Impetigo, unspecified: Secondary | ICD-10-CM

## 2020-11-25 DIAGNOSIS — G4733 Obstructive sleep apnea (adult) (pediatric): Secondary | ICD-10-CM

## 2020-11-25 DIAGNOSIS — R195 Other fecal abnormalities: Secondary | ICD-10-CM

## 2020-11-25 DIAGNOSIS — K21 GERD with esophagitis: Secondary | ICD-10-CM

## 2020-11-25 MED ORDER — AMLODIPINE BESYLATE 10 MG PO TABS
3 refills | Status: CP
Start: 2020-11-25 — End: ?

## 2020-11-25 MED ORDER — CITALOPRAM HYDROBROMIDE 20 MG PO TABS
1 refills | Status: CP
Start: 2020-11-25 — End: ?

## 2020-11-26 DIAGNOSIS — Z90711 Acquired absence of uterus with remaining cervical stump: Secondary | ICD-10-CM

## 2020-11-26 DIAGNOSIS — N939 Abnormal uterine and vaginal bleeding, unspecified: Principal | ICD-10-CM

## 2020-12-11 ENCOUNTER — Encounter

## 2021-01-06 ENCOUNTER — Encounter

## 2021-01-14 ENCOUNTER — Encounter

## 2021-02-28 ENCOUNTER — Inpatient Hospital Stay: Admit: 2021-02-28 | Discharge: 2021-03-01

## 2021-02-28 DIAGNOSIS — N939 Abnormal uterine and vaginal bleeding, unspecified: Principal | ICD-10-CM

## 2021-02-28 DIAGNOSIS — Z90711 Acquired absence of uterus with remaining cervical stump: Secondary | ICD-10-CM

## 2021-02-28 MED ORDER — GADOTERATE MEGLUMINE 10 MMOL/20ML IV SOLN
20 mL | Freq: Once | INTRAVENOUS | Status: CP
Start: 2021-02-28 — End: ?

## 2021-02-28 MED ORDER — SODIUM CHLORIDE 0.9% FOR FLUSHES
20-180 mL | INTRAVENOUS | Status: CP | PRN
Start: 2021-02-28 — End: ?

## 2021-03-07 ENCOUNTER — Ambulatory Visit

## 2021-03-07 DIAGNOSIS — Z6841 Body Mass Index (BMI) 40.0 and over, adult: Secondary | ICD-10-CM

## 2021-03-07 DIAGNOSIS — G43909 Migraine, unspecified, not intractable, without status migrainosus: Secondary | ICD-10-CM

## 2021-03-07 DIAGNOSIS — Z90711 Acquired absence of uterus with remaining cervical stump: Secondary | ICD-10-CM

## 2021-03-07 DIAGNOSIS — E785 Hyperlipidemia, unspecified: Secondary | ICD-10-CM

## 2021-03-07 DIAGNOSIS — L0100 Impetigo, unspecified: Secondary | ICD-10-CM

## 2021-03-07 DIAGNOSIS — I10 Essential (primary) hypertension: Principal | ICD-10-CM

## 2021-03-07 DIAGNOSIS — R195 Other fecal abnormalities: Secondary | ICD-10-CM

## 2021-03-07 DIAGNOSIS — J301 Allergic rhinitis due to pollen: Secondary | ICD-10-CM

## 2021-03-07 DIAGNOSIS — K21 GERD with esophagitis: Secondary | ICD-10-CM

## 2021-03-07 DIAGNOSIS — S0510XA Contusion of eyeball and orbital tissues, unspecified eye, initial encounter: Secondary | ICD-10-CM

## 2021-03-07 DIAGNOSIS — G4733 Obstructive sleep apnea (adult) (pediatric): Secondary | ICD-10-CM

## 2021-03-07 DIAGNOSIS — Z800 Family history of malignant neoplasm of digestive organs: Secondary | ICD-10-CM

## 2021-03-07 DIAGNOSIS — M545 Acute bilateral low back pain without sciatica: Secondary | ICD-10-CM

## 2021-03-07 DIAGNOSIS — D649 Anemia, unspecified: Secondary | ICD-10-CM

## 2021-03-07 DIAGNOSIS — M62838 Other muscle spasm: Secondary | ICD-10-CM

## 2021-03-07 DIAGNOSIS — N83209 Unspecified ovarian cyst, unspecified side: Secondary | ICD-10-CM

## 2021-03-07 DIAGNOSIS — Z91199 Personal history of noncompliance with medical treatment, presenting hazards to health: Secondary | ICD-10-CM

## 2021-03-07 DIAGNOSIS — K219 Gastro-esophageal reflux disease without esophagitis: Secondary | ICD-10-CM

## 2021-03-07 DIAGNOSIS — D229 Melanocytic nevi, unspecified: Secondary | ICD-10-CM

## 2021-03-07 DIAGNOSIS — N939 Abnormal uterine and vaginal bleeding, unspecified: Principal | ICD-10-CM

## 2021-03-07 MED ORDER — AMOXICILLIN 500 MG PO CAPS
500 mg | Freq: Three times a day (TID) | ORAL
Start: 2021-03-07 — End: 2021-03-08

## 2021-03-24 ENCOUNTER — Encounter: Attending: Student in an Organized Health Care Education/Training Program

## 2021-04-01 DIAGNOSIS — N939 Abnormal uterine and vaginal bleeding, unspecified: Secondary | ICD-10-CM

## 2021-04-01 DIAGNOSIS — Z90711 Acquired absence of uterus with remaining cervical stump: Principal | ICD-10-CM

## 2021-04-03 ENCOUNTER — Encounter

## 2021-04-17 ENCOUNTER — Encounter

## 2021-05-08 ENCOUNTER — Ambulatory Visit: Attending: Dentist

## 2021-05-08 DIAGNOSIS — J301 Allergic rhinitis due to pollen: Secondary | ICD-10-CM

## 2021-05-08 DIAGNOSIS — Z800 Family history of malignant neoplasm of digestive organs: Secondary | ICD-10-CM

## 2021-05-08 DIAGNOSIS — K219 Gastro-esophageal reflux disease without esophagitis: Secondary | ICD-10-CM

## 2021-05-08 DIAGNOSIS — G4733 Obstructive sleep apnea (adult) (pediatric): Secondary | ICD-10-CM

## 2021-05-08 DIAGNOSIS — S0510XA Contusion of eyeball and orbital tissues, unspecified eye, initial encounter: Secondary | ICD-10-CM

## 2021-05-08 DIAGNOSIS — K21 GERD with esophagitis: Secondary | ICD-10-CM

## 2021-05-08 DIAGNOSIS — G43909 Migraine, unspecified, not intractable, without status migrainosus: Secondary | ICD-10-CM

## 2021-05-08 DIAGNOSIS — L0100 Impetigo, unspecified: Secondary | ICD-10-CM

## 2021-05-08 DIAGNOSIS — Z91199 Personal history of noncompliance with medical treatment, presenting hazards to health: Secondary | ICD-10-CM

## 2021-05-08 DIAGNOSIS — K029 Dental caries, unspecified: Principal | ICD-10-CM

## 2021-05-08 DIAGNOSIS — I10 Essential (primary) hypertension: Principal | ICD-10-CM

## 2021-05-08 DIAGNOSIS — N83209 Unspecified ovarian cyst, unspecified side: Secondary | ICD-10-CM

## 2021-05-08 DIAGNOSIS — M545 Acute bilateral low back pain without sciatica: Secondary | ICD-10-CM

## 2021-05-08 DIAGNOSIS — R195 Other fecal abnormalities: Secondary | ICD-10-CM

## 2021-05-08 DIAGNOSIS — D229 Melanocytic nevi, unspecified: Secondary | ICD-10-CM

## 2021-05-08 DIAGNOSIS — M62838 Other muscle spasm: Secondary | ICD-10-CM

## 2021-05-08 DIAGNOSIS — D649 Anemia, unspecified: Secondary | ICD-10-CM

## 2021-05-19 ENCOUNTER — Encounter: Attending: Student in an Organized Health Care Education/Training Program

## 2021-05-22 ENCOUNTER — Encounter

## 2021-05-22 DIAGNOSIS — Z8679 Personal history of other diseases of the circulatory system: Secondary | ICD-10-CM

## 2021-05-22 DIAGNOSIS — Z91148 H/O medication noncompliance: Principal | ICD-10-CM

## 2021-05-22 MED ORDER — HYDROCHLOROTHIAZIDE 25 MG PO TABS
3 refills | Status: CP
Start: 2021-05-22 — End: ?

## 2021-06-05 ENCOUNTER — Ambulatory Visit: Attending: Dentist

## 2021-06-05 DIAGNOSIS — M545 Acute bilateral low back pain without sciatica: Secondary | ICD-10-CM

## 2021-06-05 DIAGNOSIS — I10 Essential (primary) hypertension: Principal | ICD-10-CM

## 2021-06-05 DIAGNOSIS — K219 Gastro-esophageal reflux disease without esophagitis: Secondary | ICD-10-CM

## 2021-06-05 DIAGNOSIS — G4733 Obstructive sleep apnea (adult) (pediatric): Secondary | ICD-10-CM

## 2021-06-05 DIAGNOSIS — J301 Allergic rhinitis due to pollen: Secondary | ICD-10-CM

## 2021-06-05 DIAGNOSIS — D649 Anemia, unspecified: Secondary | ICD-10-CM

## 2021-06-05 DIAGNOSIS — K21 GERD with esophagitis: Secondary | ICD-10-CM

## 2021-06-05 DIAGNOSIS — G43909 Migraine, unspecified, not intractable, without status migrainosus: Secondary | ICD-10-CM

## 2021-06-05 DIAGNOSIS — S0510XA Contusion of eyeball and orbital tissues, unspecified eye, initial encounter: Secondary | ICD-10-CM

## 2021-06-05 DIAGNOSIS — M62838 Other muscle spasm: Secondary | ICD-10-CM

## 2021-06-05 DIAGNOSIS — R195 Other fecal abnormalities: Secondary | ICD-10-CM

## 2021-06-05 DIAGNOSIS — Z91199 Personal history of noncompliance with medical treatment, presenting hazards to health: Secondary | ICD-10-CM

## 2021-06-05 DIAGNOSIS — D229 Melanocytic nevi, unspecified: Secondary | ICD-10-CM

## 2021-06-05 DIAGNOSIS — K029 Dental caries, unspecified: Principal | ICD-10-CM

## 2021-06-05 DIAGNOSIS — Z800 Family history of malignant neoplasm of digestive organs: Secondary | ICD-10-CM

## 2021-06-05 DIAGNOSIS — N83209 Unspecified ovarian cyst, unspecified side: Secondary | ICD-10-CM

## 2021-06-05 DIAGNOSIS — L0100 Impetigo, unspecified: Secondary | ICD-10-CM

## 2021-07-30 DIAGNOSIS — R4589 Other symptoms and signs involving emotional state: Principal | ICD-10-CM

## 2021-07-30 DIAGNOSIS — N939 Abnormal uterine and vaginal bleeding, unspecified: Principal | ICD-10-CM

## 2021-07-30 MED ORDER — ESTROGENS CONJUGATED 0.625 MG/GM VA CREA
.5 g | Freq: Every day | VAGINAL | 1 refills
Start: 2021-07-30 — End: ?

## 2021-07-30 MED ORDER — CITALOPRAM HYDROBROMIDE 20 MG PO TABS
20 mg | Freq: Every day | ORAL | 1 refills
Start: 2021-07-30 — End: ?

## 2021-07-31 DIAGNOSIS — R4589 Other symptoms and signs involving emotional state: Principal | ICD-10-CM

## 2021-07-31 MED ORDER — CITALOPRAM HYDROBROMIDE 20 MG PO TABS
20 mg | Freq: Every day | ORAL | 1 refills
Start: 2021-07-31 — End: ?

## 2021-08-01 DIAGNOSIS — R4589 Other symptoms and signs involving emotional state: Principal | ICD-10-CM

## 2021-08-01 MED ORDER — CITALOPRAM HYDROBROMIDE 20 MG PO TABS
20 mg | Freq: Every day | ORAL | 0 refills | Status: CP
Start: 2021-08-01 — End: ?

## 2021-08-13 ENCOUNTER — Encounter
